# Patient Record
Sex: Female | Born: 1955 | Race: White | Hispanic: No | Marital: Married | Smoking: Former smoker
Health system: Southern US, Community
[De-identification: ages and names within clinical notes are randomized; demographics above are authoritative.]

## PROBLEM LIST (undated history)

## (undated) DIAGNOSIS — J45909 Unspecified asthma, uncomplicated: Secondary | ICD-10-CM

## (undated) DIAGNOSIS — R002 Palpitations: Secondary | ICD-10-CM

## (undated) DIAGNOSIS — C4491 Basal cell carcinoma of skin, unspecified: Secondary | ICD-10-CM

## (undated) DIAGNOSIS — E8801 Alpha-1-antitrypsin deficiency: Secondary | ICD-10-CM

## (undated) HISTORY — DX: Basal cell carcinoma of skin, unspecified: C44.91

## (undated) HISTORY — PX: TONSILLECTOMY AND ADENOIDECTOMY: SUR1326

## (undated) HISTORY — DX: Palpitations: R00.2

## (undated) HISTORY — PX: APPENDECTOMY: SHX54

## (undated) HISTORY — PX: BREAST CYST ASPIRATION: SHX578

---

## 2015-08-14 ENCOUNTER — Encounter (HOSPITAL_COMMUNITY): Payer: Self-pay | Admitting: Emergency Medicine

## 2015-08-14 ENCOUNTER — Emergency Department (HOSPITAL_COMMUNITY): Payer: BLUE CROSS/BLUE SHIELD

## 2015-08-14 ENCOUNTER — Emergency Department (HOSPITAL_COMMUNITY)
Admission: EM | Admit: 2015-08-14 | Discharge: 2015-08-14 | Disposition: A | Discharge status: Home / Self Care | Payer: BLUE CROSS/BLUE SHIELD | Attending: Emergency Medicine | Admitting: Emergency Medicine

## 2015-08-14 DIAGNOSIS — R002 Palpitations: Secondary | ICD-10-CM | POA: Diagnosis present

## 2015-08-14 DIAGNOSIS — J45909 Unspecified asthma, uncomplicated: Secondary | ICD-10-CM | POA: Insufficient documentation

## 2015-08-14 DIAGNOSIS — R110 Nausea: Secondary | ICD-10-CM | POA: Insufficient documentation

## 2015-08-14 DIAGNOSIS — R42 Dizziness and giddiness: Secondary | ICD-10-CM | POA: Insufficient documentation

## 2015-08-14 HISTORY — DX: Unspecified asthma, uncomplicated: J45.909

## 2015-08-14 HISTORY — DX: Alpha-1-antitrypsin deficiency: E88.01

## 2015-08-14 LAB — CBC
HEMATOCRIT: 41.2 % (ref 36.0–46.0)
HEMOGLOBIN: 14.1 g/dL (ref 12.0–15.0)
MCH: 31.4 pg (ref 26.0–34.0)
MCHC: 34.2 g/dL (ref 30.0–36.0)
MCV: 91.8 fL (ref 78.0–100.0)
Platelets: 283 10*3/uL (ref 150–400)
RBC: 4.49 MIL/uL (ref 3.87–5.11)
RDW: 11.9 % (ref 11.5–15.5)
WBC: 8.9 10*3/uL (ref 4.0–10.5)

## 2015-08-14 LAB — BASIC METABOLIC PANEL
ANION GAP: 7 (ref 5–15)
BUN: 16 mg/dL (ref 6–20)
CALCIUM: 9.7 mg/dL (ref 8.9–10.3)
CO2: 23 mmol/L (ref 22–32)
Chloride: 108 mmol/L (ref 101–111)
Creatinine, Ser: 0.8 mg/dL (ref 0.44–1.00)
GFR calc Af Amer: >60 mL/min (ref >60)
GFR calc non Af Amer: >60 mL/min (ref >60)
GLUCOSE: 113 mg/dL — AB (ref 65–99)
Potassium: 3.6 mmol/L (ref 3.5–5.1)
Sodium: 138 mmol/L (ref 135–145)

## 2015-08-14 LAB — I-STAT TROPONIN, ED
TROPONIN I, POC: 0.01 ng/mL (ref 0.00–0.08)
Troponin i, poc: 0 ng/mL (ref 0.00–0.08)

## 2015-08-14 MED ORDER — LORAZEPAM 1 MG PO TABS
0.5000 mg | ORAL_TABLET | Freq: Once | ORAL | Status: AC
  Administered 2015-08-14: 0.5 mg via ORAL
  Filled 2015-08-14: qty 1

## 2015-08-14 MED ORDER — LORAZEPAM 1 MG PO TABS: 1.0000 mg | ORAL_TABLET | Freq: Once | ORAL | Status: DC

## 2015-08-14 NOTE — ED Notes (Signed)
Pt. reports palpitations and upper chest discomfort " pressure" with mild nausea onset this evening , denies SOB , emesis or diaphoresis . Pt. took 1 ASA 81 mg prior to arrival .

## 2015-08-14 NOTE — ED Provider Notes (Signed)
CSN: [id number]     Arrival date & time 08/14/15  0031 History  By signing my name below, I, Emily Hays, attest that this documentation has been prepared under the direction and in the presence of Ripley Fraise, MD. Electronically Signed: Randa Hays, ED Scribe. 08/14/2015. 1:29 AM.     Chief Complaint  Patient presents with  . Palpitations    Patient is a 59 y.o. female presenting with palpitations. The history is provided by the patient. No language interpreter was used.  Palpitations Duration:  5 hours Progression:  Unchanged Chronicity:  Recurrent Relieved by:  None tried Worsened by:  Nothing Ineffective treatments:  None tried Associated symptoms: dizziness and nausea   Associated symptoms: no diaphoresis and no vomiting   HPI Comments: Emily Hays is a 59 y.o. female who presents to the Emergency Department complaining of recurrent palpitations and chest discomfort that began 5 hours PTA. Pt describes the palpitations as her "chest pounding and stopping." She does report nausea and dizziness tonight as well. Pt reports that several years prior she did have similar episodes of palpitations often but states that her last episode was several months prior. Pt states she has had cardiac work up including nuclear stress test and ECG within past 2 years. She states that she was told that she is having PVC's that are related to post menopause. She denies Hx of MI, PE or DVT's. Pt denies Hx of tobacco use or alcohol use. She denies abdominal pain, leg swelling or diaphoresis.      Past Medical History  Diagnosis Date  . Alpha-1-antitrypsin deficiency ([place])   . Asthma    Past Surgical History  Procedure Laterality Date  . Appendectomy    . Tonsillectomy     No family history on file. Social History  Substance Use Topics  . Smoking status: Never Smoker   . Smokeless tobacco: None  . Alcohol Use: Yes   OB History    No data available      Review of Systems   Constitutional: Negative for diaphoresis.  Cardiovascular: Positive for palpitations. Negative for leg swelling.  Gastrointestinal: Positive for nausea. Negative for vomiting and abdominal pain.  Neurological: Positive for dizziness. Negative for syncope.  All other systems reviewed and are negative.    Allergies  Other  Home Medications   Prior to Admission medications   Medication Sig Start Date End Date Taking? Authorizing Provider  Aspirin-Acetaminophen-Caffeine (EXCEDRIN PO) Take 2 tablets by mouth daily as needed (for headache).   Yes Historical Provider, MD  diphenhydrAMINE (BENADRYL) 25 MG tablet Take 25 mg by mouth at bedtime as needed for sleep.   Yes Historical Provider, MD  ibuprofen (ADVIL,MOTRIN) 200 MG tablet Take 400 mg by mouth every 6 (six) hours as needed for moderate pain.   Yes Historical Provider, MD   BP 139/90 mmHg  Pulse 96  Temp(Src) 97.6 F (36.4 C) (Oral)  Resp 20  Ht 5' 4.5" (1.638 m)  Wt 184 lb (83.462 kg)  BMI 31.11 kg/m2  SpO2 94%   Physical Exam   CONSTITUTIONAL: Well developed/well nourished HEAD: Normocephalic/atraumatic EYES: EOMI/PERRL ENMT: Mucous membranes moist NECK: supple no meningeal signs SPINE/BACK:entire spine nontender CV: S1/S2 noted, no murmurs/rubs/gallops noted LUNGS: Lungs are clear to auscultation bilaterally, no apparent distress ABDOMEN: soft, nontender, no rebound or guarding, bowel sounds noted throughout abdomen GU:no cva tenderness NEURO: Pt is awake/alert/appropriate, moves all extremitiesx4.  No facial droop.   EXTREMITIES: pulses normal/equal, full ROM SKIN: warm, color normal  PSYCH: no abnormalities of mood noted, alert and oriented to situation  ED Course  Procedures  DIAGNOSTIC STUDIES: Oxygen Saturation is 94% on RA, adequate by my interpretation.    COORDINATION OF CARE: 1:07 AM-Discussed treatment plan with pt at bedside and pt agreed to plan.   HEART score 3 Pt mostly complaining of  palpitations and reports long h/o similar episodes I doubt PE Will advise to monitor and check repeat ekg/troponin  4:29 AM Repeat troponin negative Pt appears improved I doubt ACS/PE or significant arrhythmia at this time Stable for d/c home Referred to local cardiology BP 112/64 mmHg  Pulse 78  Temp(Src) 97.6 F (36.4 C) (Oral)  Resp 20  Ht 5' 4.5" (1.638 m)  Wt 184 lb (83.462 kg)  BMI 31.11 kg/m2  SpO2 95%   Labs Review Labs Reviewed  BASIC METABOLIC PANEL - Abnormal; Notable for the following:    Glucose, Bld 113 (*)    All other components within normal limits  CBC  I-STAT TROPOININ, ED  Randolm Idol, ED    Imaging Review Dg Chest 2 View  08/14/2015  CLINICAL DATA:  Chest pressure.  Palpitations. EXAM: CHEST  2 VIEW COMPARISON:  None. FINDINGS: Borderline hyperinflation and bronchitic markings. Cardiomediastinal contours are normal. No pulmonary edema, focal consolidation, pleural effusion or pneumothorax. Probable calcified granuloma in the left upper lung. No acute osseous abnormalities are seen. IMPRESSION: Borderline hyperinflation and bronchitic change. Electronically Signed   By: Jeb Levering M.D.   On: 08/14/2015 01:17   I have personally reviewed and evaluated these images and lab results as part of my medical decision-making.   EKG Interpretation   Date/Time:  Sunday August 14 2015 00:39:59 EST Ventricular Rate:  99 PR Interval:  136 QRS Duration: 82 QT Interval:  358 QTC Calculation: 459 R Axis:   -65 Text Interpretation:  Sinus rhythm with frequent Premature ventricular  complexes Left anterior fascicular block Possible Anterior infarct , age  undetermined Abnormal ECG No previous ECGs available Confirmed by Christy Gentles   MD, [place] ([id number]) on 08/14/2015 12:44:57 AM      MDM   Final diagnoses:  Palpitations      Nursing notes including past medical history and social history reviewed and considered in documentation xrays/imaging  reviewed by myself and considered during evaluation Labs/vital reviewed myself and considered during evaluation   I personally performed the services described in this documentation, which was scribed in my presence. The recorded information has been reviewed and is accurate.       Ripley Fraise, MD 08/14/15 0430

## 2015-08-14 NOTE — Discharge Instructions (Signed)

## 2015-08-16 ENCOUNTER — Encounter: Payer: Self-pay | Admitting: Cardiovascular Disease

## 2015-08-16 ENCOUNTER — Ambulatory Visit (INDEPENDENT_AMBULATORY_CARE_PROVIDER_SITE_OTHER): Payer: BLUE CROSS/BLUE SHIELD

## 2015-08-16 ENCOUNTER — Ambulatory Visit (INDEPENDENT_AMBULATORY_CARE_PROVIDER_SITE_OTHER): Payer: BLUE CROSS/BLUE SHIELD | Admitting: Cardiovascular Disease

## 2015-08-16 VITALS — BP 124/90 | HR 98 | Ht 64.0 in | Wt 180.0 lb

## 2015-08-16 DIAGNOSIS — R002 Palpitations: Secondary | ICD-10-CM | POA: Diagnosis not present

## 2015-08-16 NOTE — Patient Instructions (Signed)
Medication Instructions:  Your physician recommends that you continue on your current medications as directed. Please refer to the Current Medication list given to you today.   Labwork: none  Testing/Procedures: Your physician has recommended that you wear an event monitor. Event monitors are medical devices that record the heart's electrical activity. Doctors most often [place] these monitors to diagnose arrhythmias. Arrhythmias are problems with the speed or rhythm of the heartbeat. The monitor is a small, portable device. You can wear one while you do your normal daily activities. This is usually used to diagnose what is causing palpitations/syncope (passing out). FOR 2 WEEKS.     Follow-Up: Your physician recommends that you schedule a follow-up appointment in: 1 MONTH with [facility]  Your physician recommends that you schedule a follow-up appointment in: 2 MONTHS WITH Dr. Gwenlyn Found   Any Other Special Instructions Will Be Listed Below (If Applicable). Dr. Gwenlyn Found would like you to check your blood pressure DAILY for the next 4 weeks.  Keep a journal of these daily BP and heart rate reading and call our office with the results. Bring with you to appointment with Erasmo Downer.     If you need a refill on your cardiac medications before your next appointment, please call your pharmacy.

## 2015-08-16 NOTE — Assessment & Plan Note (Signed)
Emily Hays was recently seen at [facility] emergency room because of symptomatic palpitations. She's had these for 10 years and she is 59 years old 3 menopause. They wax and wane. She is under a lot of stress at work and has gained a significant amount of weight due to lack of exercise and poor diet by her own account. She has seen cardiologists in the [place] where she lived previously prior to moving down to [place] one year ago. She's had multiple 2-D echocardiograms which been normal for mild MR, negative stress test and event monitors which have shown PVCs. She has not been on pharmacologic therapy for these. She was seen in the ER by Dr. Christy Gentles for palpitations. Workup essentially benign. She did have PVCs on monitor. She was referred here for further evaluation. She does admit to snoring at night and may have a component of sleep apnea. She drinks 3-4 cups of caffeine a day and is under a lot of stress at work. Talked about diet and lifestyle modification. I'm going to get a week event monitor. She was somewhat hypertensive today and I'm going to have her take her blood pressure daily and will see Emily Hays back in the office for follow-up. I'll see her back in 2 months. She continues to have some dramatic PVCs empirically begin low-dose beta-blockade.

## 2015-08-16 NOTE — Progress Notes (Signed)
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 She was referred by [facility] emergency room for evaluation of PVCs. She recently relocated from [place] to [place] to be closer to her family in [place]. She works as a Oncologist at [employer]. She has no cardiac risk factors although her mother and aunts have had A. Fib. She is also heterozygous alpha-1 antitrypsin deficiency has question of reactive airways disease. She was seen in the emergency room for palpitations. She's had palpitations for 10 years and she was 59 years old during menopause. These have waxed and waned. She does drink 2-4 cups of caffeine a day and is under a lot of stress at work. She denies chest pain or shortness of breath.   Current Outpatient Prescriptions  Medication Sig Dispense Refill  . Aspirin-Acetaminophen-Caffeine (EXCEDRIN PO) Take 2 tablets by mouth daily as needed (for headache).    . diphenhydrAMINE (BENADRYL) 25 MG tablet Take 25 mg by mouth at bedtime as needed for sleep.    Marland Kitchen ibuprofen (ADVIL,MOTRIN) 200 MG tablet Take 400 mg by mouth every 6 (six) hours as needed for moderate pain.     No current facility-administered medications for this visit.    Allergies  Allergen Reactions  . Other     MSG causes severe headaches    Social History   Social History  . Marital Status: Married    Spouse Name: N/A  . Number of Children: N/A  . Years of Education: N/A   Occupational History  . Not on file.   Social History Main Topics  . Smoking status: Never Smoker   . Smokeless tobacco: Not on file  . Alcohol Use: Yes  . Drug Use: No  .  Sexual Activity: Not on file   Other Topics Concern  . Not on file   Social History Narrative     Review of Systems: General: negative for chills, fever, night sweats or weight changes.  Cardiovascular: negative for chest pain, dyspnea on exertion, edema, orthopnea, palpitations, paroxysmal nocturnal dyspnea or shortness of breath Dermatological: negative for rash Respiratory: negative for cough or wheezing Urologic: negative for hematuria Abdominal: negative for nausea, vomiting, diarrhea, bright red blood per rectum, melena, or hematemesis Neurologic: negative for visual changes, syncope, or dizziness All other systems reviewed and are otherwise negative except as noted above.    Blood pressure 124/90, pulse 98, height 5\' 4"  (1.626 m), weight 180 lb (81.647 kg).  General appearance: alert and no distress Neck: no adenopathy, no carotid bruit, no JVD, supple, symmetrical, trachea midline and thyroid not enlarged, symmetric, no tenderness/mass/nodules Lungs: clear to auscultation bilaterally Heart: regular rate and rhythm, S1, S2 normal, no murmur, click, rub or gallop Extremities: extremities normal, atraumatic, no cyanosis or edema  EKG not performed today  ASSESSMENT AND PLAN:   Palpitations Miss Georgina Peer was recently seen at [facility] emergency room because of symptomatic palpitations. She's had these for 10 years and she is 59 years old 3 menopause. They wax and wane. She is under a lot of stress at work and has gained a significant amount of weight due to lack of exercise and poor  diet by her own account. She has seen cardiologists in the [place] where she lived previously prior to moving down to [place] one year ago. She's had multiple 2-D echocardiograms which been normal for mild MR, negative stress test and event monitors which have shown PVCs. She has not been on pharmacologic therapy for these. She was seen in the ER by Dr. Christy Gentles for palpitations. Workup essentially  benign. She did have PVCs on monitor. She was referred here for further evaluation. She does admit to snoring at night and may have a component of sleep apnea. She drinks 3-4 cups of caffeine a day and is under a lot of stress at work. Talked about diet and lifestyle modification. I'm going to get a week event monitor. She was somewhat hypertensive today and I'm going to have her take her blood pressure daily and will see Erasmo Downer back in the office for follow-up. I'll see her back in 2 months. She continues to have some dramatic PVCs empirically begin low-dose beta-blockade.      Lorretta Harp MD FACP,FACC,FAHA, [facility] 08/16/2015 2:12 PM

## 2015-08-17 ENCOUNTER — Telehealth: Payer: Self-pay | Admitting: Cardiovascular Disease

## 2015-08-17 NOTE — Telephone Encounter (Signed)
Patient states she has changed the electrode but about every 10 minutes an alarm goes off saying the red is not connected.  Instructed to call [facility] and they can work her through the problem.  They may need to send out a new monitor with lead wires.  (the wire may be fractured).  Patient voiced understanding and will call them today.

## 2015-08-17 NOTE — Telephone Encounter (Signed)
Emily Hays is calling because she has a monitor on and it keeps telling her that the red is not connected . States that it is in the right place , but it keeps going off. Please call   Thanks

## 2015-09-15 ENCOUNTER — Ambulatory Visit: Payer: BLUE CROSS/BLUE SHIELD | Admitting: Pharmacist Clinician (PhC)/ Clinical Pharmacy Specialist

## 2015-11-09 ENCOUNTER — Ambulatory Visit: Payer: BLUE CROSS/BLUE SHIELD | Admitting: Cardiovascular Disease

## 2015-11-30 ENCOUNTER — Ambulatory Visit: Payer: BLUE CROSS/BLUE SHIELD | Admitting: Cardiovascular Disease

## 2016-02-16 ENCOUNTER — Other Ambulatory Visit: Payer: Self-pay | Admitting: Physician Assistant

## 2016-02-16 DIAGNOSIS — Z1231 Encounter for screening mammogram for malignant neoplasm of breast: Secondary | ICD-10-CM

## 2016-03-08 ENCOUNTER — Ambulatory Visit: Payer: BLUE CROSS/BLUE SHIELD | Admitting: Obstetrics

## 2016-03-19 ENCOUNTER — Ambulatory Visit
Admission: RE | Admit: 2016-03-19 | Discharge: 2016-03-19 | Disposition: A | Discharge status: Home / Self Care | Payer: BLUE CROSS/BLUE SHIELD | Source: Ambulatory Visit | Attending: Physician Assistant | Admitting: Physician Assistant

## 2016-03-19 DIAGNOSIS — Z1231 Encounter for screening mammogram for malignant neoplasm of breast: Secondary | ICD-10-CM

## 2016-03-22 ENCOUNTER — Encounter: Payer: Self-pay | Admitting: Internal Medicine

## 2016-03-22 ENCOUNTER — Ambulatory Visit (INDEPENDENT_AMBULATORY_CARE_PROVIDER_SITE_OTHER): Payer: BLUE CROSS/BLUE SHIELD | Admitting: Internal Medicine

## 2016-03-22 ENCOUNTER — Encounter (INDEPENDENT_AMBULATORY_CARE_PROVIDER_SITE_OTHER): Payer: Self-pay

## 2016-03-22 ENCOUNTER — Other Ambulatory Visit: Payer: BLUE CROSS/BLUE SHIELD

## 2016-03-22 VITALS — BP 116/84 | HR 90 | Ht 63.5 in | Wt 172.0 lb

## 2016-03-22 DIAGNOSIS — J449 Chronic obstructive pulmonary disease, unspecified: Secondary | ICD-10-CM

## 2016-03-22 DIAGNOSIS — Z23 Encounter for immunization: Secondary | ICD-10-CM | POA: Diagnosis not present

## 2016-03-22 DIAGNOSIS — J45909 Unspecified asthma, uncomplicated: Secondary | ICD-10-CM | POA: Diagnosis not present

## 2016-03-22 DIAGNOSIS — J4489 Other specified chronic obstructive pulmonary disease: Secondary | ICD-10-CM | POA: Insufficient documentation

## 2016-03-22 MED ORDER — MOMETASONE FURO-FORMOTEROL FUM 200-5 MCG/ACT IN AERO
2.0000 | INHALATION_SPRAY | Freq: Two times a day (BID) | RESPIRATORY_TRACT | Status: DC | PRN
Start: 2016-03-22 — End: 2018-12-30

## 2016-03-22 NOTE — Patient Instructions (Addendum)
Work on inhaler technique:  relax and gently blow all the way out then take a nice smooth deep breath back in, triggering the inhaler at same time you start breathing in.  Hold for up to 5 seconds if you can. Blow out thru nose. Rinse and gargle with water when done  dulera 200 up to 2 pffs every 12  Hours as needed   Prevar 13 now   Please remember to go to the lab  department downstairs for your tests - we will call you with the results when they are available.  Please schedule a follow up visit in 3 months but call sooner if needed with pfts  On return

## 2016-03-22 NOTE — Progress Notes (Signed)
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 Pulmonary Consult    Referred by Nicholes Rough, NP. Pt states that she was dxed with Alpha 1 Antitrypsin def in 2013. She was dxed with asthma in 2013. She has occ cough with white sputum, wheezing and DOE.   doe = MMRC1 = can walk nl pace, flat grade, can't hurry or go uphills or steps s sob   Only uses dulera during flares, not clear whether using it more regularly corrects her doe   No obvious   patterns in day to day or daytime variabilty or assoc   cp or chest tightness,  overt sinus or hb symptoms. No unusual exp hx or h/o childhood pna/ asthma or knowledge of premature birth.  Sleeping ok without nocturnal  or early am exacerbation  of respiratory  c/o's or need for noct saba. Also denies any obvious fluctuation of symptoms with weather or environmental changes or other aggravating or alleviating factors except as outlined above   Current Medications, Allergies, Complete Past Medical History, Past Surgical History, Family History, and Social History were reviewed in [employer] record.                Review of Systems  Constitutional: Negative for fever, chills and unexpected weight change.  HENT: Positive for congestion and dental problem. Negative for ear pain, nosebleeds, postnasal drip, rhinorrhea, sinus pressure, sneezing, sore throat, trouble swallowing and voice change.   Eyes: Negative for visual disturbance.  Respiratory: Positive for cough and shortness of breath.  Negative for choking.   Cardiovascular: Negative for chest pain and leg swelling.  Gastrointestinal: Negative for vomiting, abdominal pain and diarrhea.  Genitourinary: Negative for difficulty urinating.  Musculoskeletal: Positive for arthralgias.  Skin: Negative for rash.  Neurological: Negative for tremors, syncope and headaches.  Hematological: Does not bruise/bleed easily.       Objective:   Physical Exam   amb wf nad / minimal pseudowheeze with fvc only   Wt Readings from Last 3 Encounters:  03/22/16 172 lb (78.019 kg)  08/16/15 180 lb (81.647 kg)  08/14/15 184 lb (83.462 kg)    Vital signs reviewed  HEENT: nl dentition, turbinates, and oropharynx. Nl external ear canals without cough reflex   NECK :  without JVD/Nodes/TM/ nl carotid upstrokes bilaterally   LUNGS: no acc muscle use,  Nl contour chest which is clear to A and P bilaterally without cough on insp or exp maneuvers   CV:  RRR  no s3 or murmur or increase in P2, no edema   ABD:  soft and nontender with nl inspiratory excursion in the supine position. No bruits or organomegaly, bowel sounds nl  MS:  Nl gait/ ext warm without deformities, calf tenderness, cyanosis or clubbing No obvious joint restrictions   SKIN: warm and dry without lesions    NEURO:  alert, approp, nl sensorium with  no motor deficits      I personally  reviewed images and agree with radiology impression as follows:  CXR:  08/14/15 Borderline hyperinflation and bronchitic change.         Assessment & Plan:

## 2016-03-22 NOTE — Assessment & Plan Note (Addendum)
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  If we find over time that she has an accelerated post saba FEV1 decline then can address potential for replacement therapy, but I doubt strongly this will be the case  Total time devoted to counseling  = 35/67m review case with pt/ discussion of options/alternatives/ personally creating written instructions  in presence of pt  then going over those specific  Instructions directly with the pt including how to use all of the meds but in particular covering each new medication in detail and the difference between the maintenance/automatic meds and the prns using an action plan format for the latter.

## 2016-03-26 ENCOUNTER — Ambulatory Visit: Payer: BLUE CROSS/BLUE SHIELD | Admitting: Obstetrics

## 2016-03-26 LAB — ALPHA-1-ANTITRYPSIN: A-1 Antitrypsin, Ser: 89 mg/dL (ref 83–199)

## 2016-03-28 LAB — ALPHA-1 ANTITRYPSIN PHENOTYPE: A-1 Antitrypsin: 83 mg/dL (ref 83–199)

## 2016-03-29 ENCOUNTER — Telehealth: Payer: Self-pay | Admitting: Internal Medicine

## 2016-03-29 NOTE — Telephone Encounter (Signed)
Results have been explained to patient, pt expressed understanding. Nothing further needed.  Notes Recorded by Rosana Berger, CMA on 03/29/2016 at 11:43 AM lmtcb Notes Recorded by Tanda Rockers, MD on 03/29/2016 at 5:58 AM Call patient : Study is c/w carrier state, Only of concern at this point for her siblings and offspring who should all be checked to be complete - if they marry another carrier, the child would have a 25% of being ZZ with serious health concerns.

## 2016-03-29 NOTE — Progress Notes (Signed)
Quick Note:  lmtcb ______ 

## 2016-03-30 NOTE — Progress Notes (Signed)
Quick Note:  Pt was notified of results ______

## 2016-04-02 ENCOUNTER — Ambulatory Visit: Payer: BLUE CROSS/BLUE SHIELD | Admitting: Obstetrics

## 2016-06-22 ENCOUNTER — Ambulatory Visit (INDEPENDENT_AMBULATORY_CARE_PROVIDER_SITE_OTHER): Payer: BLUE CROSS/BLUE SHIELD | Admitting: Internal Medicine

## 2016-06-22 ENCOUNTER — Encounter: Payer: Self-pay | Admitting: Internal Medicine

## 2016-06-22 VITALS — BP 114/68 | HR 88 | Ht 64.5 in | Wt 171.0 lb

## 2016-06-22 DIAGNOSIS — J45909 Unspecified asthma, uncomplicated: Secondary | ICD-10-CM

## 2016-06-22 DIAGNOSIS — J449 Chronic obstructive pulmonary disease, unspecified: Secondary | ICD-10-CM

## 2016-06-22 LAB — PULMONARY FUNCTION TEST
DL/VA % pred: 105 %
DL/VA: 5.12 ml/min/mmHg/L
DLCO UNC: 22.25 ml/min/mmHg
DLCO cor % pred: 86 %
DLCO cor: 21.5 ml/min/mmHg
DLCO unc % pred: 89 %
FEF 25-75 Post: 2.49 L/sec
FEF 25-75 Pre: 2.2 L/sec
FEF2575-%Change-Post: 12 %
FEF2575-%PRED-POST: 107 %
FEF2575-%Pred-Pre: 95 %
FEV1-%CHANGE-POST: 1 %
FEV1-%PRED-POST: 79 %
FEV1-%Pred-Pre: 78 %
FEV1-PRE: 2 L
FEV1-Post: 2.04 L
FEV1FVC-%Change-Post: 2 %
FEV1FVC-%Pred-Pre: 104 %
FEV6-%Change-Post: 0 %
FEV6-%PRED-PRE: 76 %
FEV6-%Pred-Post: 76 %
FEV6-POST: 2.44 L
FEV6-Pre: 2.46 L
FEV6FVC-%PRED-POST: 104 %
FEV6FVC-%Pred-Pre: 104 %
FVC-%CHANGE-POST: 0 %
FVC-%PRED-POST: 73 %
FVC-%PRED-PRE: 73 %
FVC-POST: 2.44 L
FVC-PRE: 2.46 L
POST FEV6/FVC RATIO: 100 %
PRE FEV6/FVC RATIO: 100 %
Post FEV1/FVC ratio: 83 %
Pre FEV1/FVC ratio: 81 %
RV % PRED: 96 %
RV: 1.98 L
TLC % PRED: 88 %
TLC: 4.54 L

## 2016-06-22 NOTE — Patient Instructions (Signed)
For now ok to just dulera 200 up 2 puffs every 12 hours if needed   Pulmonary follow up is as needed

## 2016-06-22 NOTE — Assessment & Plan Note (Signed)
Alpha one levels 03/22/2016 >>>  MZ  Level 89  03/22/2016  After extensive coaching HFA effectiveness =    75% > continue dulera 200 2bid prn - Prevnar 13 03/22/2016  - PFT's  06/22/2016  wnl except for very minimal curvature/ ERV 19% off all rx    She very minimal cough and her sob as reported is MMRC1 and c/w wt gain with low erv, not limed by airflow obst  She is prone to flares and wants to use prns so best choice is dulera 200 2bid prn but if wants to do maint rx instead I would favor the 100 2bid dose longterm to minimize side effects  I had an extended discussion with the patient reviewing all relevant studies completed to date and  lasting 15 to 20 minutes of a 25 minute visit    Each maintenance medication was reviewed in detail including most importantly the difference between maintenance and prns and under what circumstances the prns are to be triggered using an action plan format that is not reflected in the computer generated alphabetically organized AVS.    Please see instructions for details which were reviewed in writing and the patient given a copy highlighting the part that I personally wrote and discussed at today's ov.   Pulmonary f/u is prn

## 2016-06-22 NOTE — Progress Notes (Signed)
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 Pulmonary Consult    Referred by Nicholes Rough, NP. Pt states that she was dxed with Alpha 1 Antitrypsin def in 2013. She was dxed with asthma in 2013. She has occ cough with white sputum, wheezing and DOE.   doe = MMRC1 = can walk nl pace, flat grade, can't hurry or go uphills or steps s sob   Only uses dulera during flares, not clear whether using it more regularly corrects her doe  rec Work on inhaler technique:  relax and gently blow all the way out then take a nice smooth deep breath back in, triggering the inhaler at same time you start breathing in.  Hold for up to 5 seconds if you can. Blow out thru nose. Rinse and gargle with water when done Dulera 200 up to 2 pffs every 12  Hours as needed  Prevar 13 now     06/22/2016  f/u ov/Adrion Menz re: asthmatic bronchitis/ not using dulera 200 at this point  Chief Complaint  Patient presents with  . Follow-up    PFT's today. Breathing is overall doing well. She has occ cough with "little pearls"- clear sputum.   cough does not wake her up in am disturb her sleep but cough happens daily randomly x years but not decades   No obvious day to day or daytime variability or limiting sob or cp or chest tightness, subjective wheeze or overt sinus or hb symptoms. No unusual exp hx or h/o childhood pna/ asthma or knowledge of premature birth.  Sleeping ok  without nocturnal  or early am exacerbation  of respiratory  c/o's or need for noct saba. Also denies any obvious fluctuation of symptoms with weather or environmental changes or other aggravating or alleviating factors except as outlined above   Current Medications, Allergies, Complete Past Medical History, Past Surgical History, Family History, and Social History were reviewed in [employer] record.  ROS  The following are not active complaints unless bolded sore throat, dysphagia, dental problems, itching, sneezing,  nasal congestion or excess/ purulent secretions, ear ache,   fever, chills, sweats, unintended wt loss, classically pleuritic or exertional cp, hemoptysis,  orthopnea pnd or leg swelling, presyncope, palpitations, abdominal pain, anorexia, nausea, vomiting, diarrhea  or change in bowel or bladder habits, change in stools or urine, dysuria,hematuria,  rash, arthralgias, visual complaints, headache, numbness, weakness or ataxia or problems with walking or coordination,  change in mood/affect or memory.           Objective:   Physical Exam   amb wf nad    06/22/2016        171  03/22/16 172 lb (78.019 kg)  08/16/15 180 lb (81.647 kg)  08/14/15 184 lb (83.462 kg)  Vital signs reviewed  HEENT: nl dentition, turbinates, and oropharynx. Nl external ear canals without cough reflex   NECK :  without JVD/Nodes/TM/ nl carotid upstrokes bilaterally   LUNGS: no acc muscle use,  Nl contour chest which is clear to A and P bilaterally without cough on insp or exp maneuvers   CV:  RRR  no s3 or murmur or increase in P2, no edema   ABD:  soft and nontender with nl inspiratory excursion in the supine position. No bruits or organomegaly, bowel sounds nl  MS:  Nl gait/ ext warm without deformities, calf tenderness, cyanosis or clubbing No obvious joint restrictions   SKIN: warm and dry without lesions    NEURO:  alert, approp, nl sensorium with  no motor  deficits      I personally reviewed images and agree with radiology impression as follows:  CXR:  08/14/15  Borderline hyperinflation and bronchitic change.         Assessment & Plan:

## 2016-06-22 NOTE — Progress Notes (Signed)
PFT performed today. 

## 2017-03-19 ENCOUNTER — Other Ambulatory Visit: Payer: Self-pay | Admitting: Physician Assistant

## 2017-03-19 DIAGNOSIS — Z1231 Encounter for screening mammogram for malignant neoplasm of breast: Secondary | ICD-10-CM

## 2017-03-22 ENCOUNTER — Other Ambulatory Visit: Payer: Self-pay | Admitting: Physician Assistant

## 2017-03-22 DIAGNOSIS — R5381 Other malaise: Secondary | ICD-10-CM

## 2017-03-29 ENCOUNTER — Other Ambulatory Visit: Payer: Self-pay | Admitting: Physician Assistant

## 2017-03-29 DIAGNOSIS — R52 Pain, unspecified: Secondary | ICD-10-CM

## 2017-04-09 ENCOUNTER — Other Ambulatory Visit: Payer: Self-pay | Admitting: Physician Assistant

## 2017-04-09 ENCOUNTER — Ambulatory Visit
Admission: RE | Admit: 2017-04-09 | Discharge: 2017-04-09 | Disposition: A | Discharge status: Home / Self Care | Payer: BLUE CROSS/BLUE SHIELD | Source: Ambulatory Visit | Attending: Physician Assistant | Admitting: Physician Assistant

## 2017-04-09 DIAGNOSIS — R52 Pain, unspecified: Secondary | ICD-10-CM

## 2017-04-09 DIAGNOSIS — N6001 Solitary cyst of right breast: Secondary | ICD-10-CM

## 2017-09-26 ENCOUNTER — Other Ambulatory Visit (HOSPITAL_COMMUNITY): Payer: Self-pay | Admitting: Physician Assistant

## 2017-09-26 DIAGNOSIS — D490 Neoplasm of unspecified behavior of digestive system: Secondary | ICD-10-CM

## 2017-09-30 ENCOUNTER — Ambulatory Visit (HOSPITAL_COMMUNITY)
Admission: RE | Admit: 2017-09-30 | Discharge: 2017-09-30 | Disposition: A | Discharge status: Home / Self Care | Payer: BLUE CROSS/BLUE SHIELD | Source: Ambulatory Visit | Attending: Physician Assistant | Admitting: Physician Assistant

## 2017-09-30 ENCOUNTER — Other Ambulatory Visit (HOSPITAL_COMMUNITY): Payer: Self-pay | Admitting: Physician Assistant

## 2017-09-30 DIAGNOSIS — D490 Neoplasm of unspecified behavior of digestive system: Secondary | ICD-10-CM | POA: Diagnosis present

## 2017-09-30 DIAGNOSIS — K862 Cyst of pancreas: Secondary | ICD-10-CM | POA: Diagnosis not present

## 2017-09-30 LAB — POCT I-STAT CREATININE: Creatinine, Ser: 0.7 mg/dL (ref 0.44–1.00)

## 2017-09-30 MED ORDER — GADOBENATE DIMEGLUMINE 529 MG/ML IV SOLN
17.0000 mL | Freq: Once | INTRAVENOUS | Status: AC | PRN
  Administered 2017-09-30: 17 mL via INTRAVENOUS

## 2017-10-11 ENCOUNTER — Other Ambulatory Visit: Payer: BLUE CROSS/BLUE SHIELD

## 2017-10-25 ENCOUNTER — Other Ambulatory Visit: Payer: Self-pay | Admitting: Physician Assistant

## 2017-10-25 ENCOUNTER — Ambulatory Visit
Admission: RE | Admit: 2017-10-25 | Discharge: 2017-10-25 | Disposition: A | Discharge status: Home / Self Care | Payer: BLUE CROSS/BLUE SHIELD | Source: Ambulatory Visit | Attending: Physician Assistant | Admitting: Physician Assistant

## 2017-10-25 DIAGNOSIS — N6001 Solitary cyst of right breast: Secondary | ICD-10-CM

## 2017-10-25 DIAGNOSIS — N63 Unspecified lump in unspecified breast: Secondary | ICD-10-CM

## 2017-10-25 DIAGNOSIS — N6310 Unspecified lump in the right breast, unspecified quadrant: Secondary | ICD-10-CM

## 2018-01-14 ENCOUNTER — Telehealth: Payer: Self-pay | Admitting: Cardiovascular Disease

## 2018-01-14 NOTE — Telephone Encounter (Signed)
Patient c/o Palpitations:  High priority if patient c/o lightheadedness, shortness of breath, or chest pain  How long have you had palpitations/irregular HR/ Afib? Are you having the symptoms now?  PVCs Are you currently experiencing lightheadedness, SOB or CP? no 1) Do you have a history of afib (atrial fibrillation) or irregular heart rhythm? no  2) Have you checked your BP or HR? (document readings if available): no  3) Are you experiencing any other symptoms?  dul thobbing

## 2018-01-14 NOTE — Telephone Encounter (Signed)
Returned the call to the patient. She stated that for the last several days she has been having what feels like PVC's and a dull ache. She stated that she is under a lot of stress at work right now and stated that in the past this has started the PVC's. The patient declined an appointment for today but has been put on the schedule for tomorrow with an APP. She will go to the hospital if her symptoms progress or she develops chest pain.

## 2018-01-15 ENCOUNTER — Encounter: Payer: Self-pay | Admitting: Physician Assistant

## 2018-01-15 ENCOUNTER — Ambulatory Visit (INDEPENDENT_AMBULATORY_CARE_PROVIDER_SITE_OTHER): Payer: BLUE CROSS/BLUE SHIELD | Admitting: Physician Assistant

## 2018-01-15 VITALS — BP 124/78 | HR 73 | Ht 64.0 in | Wt 173.0 lb

## 2018-01-15 DIAGNOSIS — I493 Ventricular premature depolarization: Secondary | ICD-10-CM

## 2018-01-15 DIAGNOSIS — R072 Precordial pain: Secondary | ICD-10-CM

## 2018-01-15 MED ORDER — METOPROLOL TARTRATE 25 MG PO TABS: ORAL_TABLET | ORAL | 0 refills | Status: DC

## 2018-01-15 MED ORDER — METOPROLOL TARTRATE 25 MG PO TABS
ORAL_TABLET | ORAL | 0 refills | Status: DC
Start: 2018-01-15 — End: 2018-01-28

## 2018-01-15 NOTE — Progress Notes (Signed)
Cardiology Office Note    Date:  01/15/2018   ID:  Emily Hays, DOB 1956-04-03, MRN [id number]  PCP:  Nicholes Rough, PA-C  Cardiologist:  Dr. Gwenlyn Found   Chief Complaint  Patient presents with  . Follow-up    seen for Dr. Gwenlyn Found. pt complains of dull chest pain in the middle, feet cramps, pt denies swelling in hands/feet, SOB,     History of Present Illness:  Emily Hays is a 62 y.o. female with PMH of heterozygous alpha-1 antitrypsin deficiency, history of skin basal cell carcinoma, asthma and history of PVCs.  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.  She started having palpitation since that she was 62 years old.  She was seen by Dr. Gwenlyn Found in November 2016, at that time she was under a lot of stress at work and a drink 2-4 cups of coffee per day.  Based on previous office note, it appears patient had echocardiogram and stress test before. 2-week event monitor in November 2016 shows normal sinus rhythm with rare PACs and PVCs.  Patient presents to cardiology office today for evaluation of substernal chest pressure that has been intermittent for the past 3 days.  She first noticed it on Sunday while sitting down.  She described it as a dull ache that lasted several hours before going away.  They recurred both yesterday at this morning as well.  She denies any obvious shortness of breath or worsening degree of chest discomfort associated with physical exertion.  She denies any recent increase of palpitation.  Multiple members of her family has pacemaker and one of her aunt had history of MI in her early 60s.  We discussed various workup options including stress testing versus coronary CT, her cardiac risk factors would include age and a family history.  I recommend a coronary CT with FFR at this time.  She will need a dose of 50 mg metoprolol in the morning of the coronary CT.  She will also need basic metabolic panel as well.  If the coronary CT is negative, she can follow-up with cardiology only on  a as needed basis.  Furthermore given the prolonged duration of the chest discomfort, I will obtain a stat troponin I today to make sure there is no evidence of ACS.   Past Medical History:  Diagnosis Date  . Alpha-1-antitrypsin deficiency ([place])   . Asthma   . Basal cell carcinoma of skin   . Palpitations    PVCs    Past Surgical History:  Procedure Laterality Date  . APPENDECTOMY    . BREAST CYST ASPIRATION    . TONSILLECTOMY AND ADENOIDECTOMY      Current Medications: Outpatient Medications Prior to Visit  Medication Sig Dispense Refill  . diphenhydrAMINE (BENADRYL) 50 MG tablet Take 50 mg by mouth at bedtime as needed for allergies or sleep.    Marland Kitchen ibuprofen (ADVIL,MOTRIN) 200 MG tablet Take 200 mg by mouth every 6 (six) hours as needed.    . mometasone-formoterol (DULERA) 200-5 MCG/ACT AERO Inhale 2 puffs into the lungs 2 (two) times daily as needed for wheezing. 1 Inhaler 11   No facility-administered medications prior to visit.      Allergies:   Other   Social History   Socioeconomic History  . Marital status: Married    Spouse name: Not on file  . Number of children: Not on file  . Years of education: Not on file  . Highest education level: Not on file  Occupational  History  . Not on file  Social Needs  . Financial resource strain: Not on file  . Food insecurity:    Worry: Not on file    Inability: Not on file  . Transportation needs:    Medical: Not on file    Non-medical: Not on file  Tobacco Use  . Smoking status: Former Smoker    Packs/day: 0.25    Years: 8.00    Pack years: 2.00    Types: Cigarettes    Last attempt to quit: 09/24/1988    Years since quitting: 29.3  . Smokeless tobacco: Never Used  Substance and Sexual Activity  . Alcohol use: Yes    Alcohol/week: 0.0 oz    Comment: occ wine  . Drug use: No  . Sexual activity: Not on file  Lifestyle  . Physical activity:    Days per week: Not on file    Minutes per session: Not on file  .  Stress: Not on file  Relationships  . Social connections:    Talks on phone: Not on file    Gets together: Not on file    Attends religious service: Not on file    Active member of club or organization: Not on file    Attends meetings of clubs or organizations: Not on file    Relationship status: Not on file  Other Topics Concern  . Not on file  Social History Narrative  . Not on file     Family History:  The patient's family history includes Alpha-1 antitrypsin deficiency in her brother; Atrial fibrillation in her mother; Breast cancer in her cousin; COPD in her mother; Cancer in her father; Head & neck cancer in her father; Heart failure in her mother; Hypertension in her mother.   ROS:   Please see the history of present illness.    ROS All other systems reviewed and are negative.   PHYSICAL EXAM:   VS:  BP 124/78   Pulse 73   Ht 5\' 4"  (1.626 m)   Wt 173 lb (78.5 kg)   BMI 29.70 kg/m    GEN: Well nourished, well developed, in no acute distress  HEENT: normal  Neck: no JVD, carotid bruits, or masses Cardiac: RRR; no murmurs, rubs, or gallops,no edema  Respiratory:  clear to auscultation bilaterally, normal work of breathing GI: soft, nontender, nondistended, + BS MS: no deformity or atrophy  Skin: warm and dry, no rash Neuro:  Alert and Oriented x 3, Strength and sensation are intact Psych: euthymic mood, full affect  Wt Readings from Last 3 Encounters:  01/15/18 173 lb (78.5 kg)  06/22/16 171 lb (77.6 kg)  03/22/16 172 lb (78 kg)      Studies/Labs Reviewed:   EKG:  EKG is ordered today.  The ekg ordered today demonstrates NSR with LAFB  Recent Labs: 09/30/2017: Creatinine, Ser 0.70   Lipid Panel No results found for: CHOL, TRIG, HDL, CHOLHDL, VLDL, LDLCALC, LDLDIRECT  Additional studies/ records that were reviewed today include:   Event monitor 08/16/2015 Study Highlights   1. NSR 2. Rare PACs and PVCs     ASSESSMENT:    1. Precordial chest pain     2. PVC's (premature ventricular contractions)      PLAN:  In order of problems listed above:  1. Precordial chest pain: She had a prolonged episode of chest pain on Sunday, will obtain troponin.  Cardiac risk factors include age and family history, given recurrence of the chest discomfort, I  will obtain coronary CT with FFR  2. History of PVCs: Well-controlled on current medication    Medication Adjustments/Labs and Tests Ordered: Current medicines are reviewed at length with the patient today.  Concerns regarding medicines are outlined above.  Medication changes, Labs and Tests ordered today are listed in the Patient Instructions below. Patient Instructions  Medication Instructions:  Your physician recommends that you continue on your current medications as directed. Please refer to the Current Medication list given to you today.  Labwork: Your physician recommends that you return for lab work in: [facility]   Your physician recommends that you return for lab work in: [facility] [place] TEST   Testing/Procedures: SEE BELOW  Follow-Up: FOLLOW UP ON A AS NEEDED BASIS PENDING TEST RESULTS  Any Other Special Instructions Will Be Listed Below (If Applicable).  If you need a refill on your cardiac medications before your next appointment, please call your pharmacy.   INSTRUCTIONS FOR CORONARY CTA Please arrive at the [facility] main entrance of [facility] at (30-45 minutes prior to test start time)  [facility] [place], [place] [address] [phone]  Proceed to the [facility] Radiology Department (First Floor).  Please follow these instructions carefully (unless otherwise directed): PLEASE HAVE LABS - BMP  AT LEAST ONE WEEK PRIOR TO TEST  On the Night Before the Test: Drink plenty of water. Do not consume any caffeinated/decaffeinated beverages or chocolate 12 hours prior to your test. Do not take any antihistamines 12 hours  prior to your test.  On the Day of the Test: Drink plenty of water. Do not drink any water within one hour of the test. Do not eat any food 4 hours prior to the test. You may take your regular medications prior to the test. Take 50 mg of Lopressor (Metoprolol) one hour before the test.  After the Test: Drink plenty of water. After receiving IV contrast, you may experience a mild flushed feeling. This is normal. On occasion, you may experience a mild rash up to 24 hours after the test. This is not dangerous. If this occurs, you can take Benadryl 25 mg and increase your fluid intake. If you experience trouble breathing, this can be serious. If it is severe call 911 IMMEDIATELY. If it is mild, please call our office.      Hilbert Corrigan, [place]  01/15/2018 5:49 PM    [facility] [place], [place], [place]  [address] Phone: [phone]; Fax: [phone]

## 2018-01-15 NOTE — Patient Instructions (Addendum)
Medication Instructions:  Your physician recommends that you continue on your current medications as directed. Please refer to the Current Medication list given to you today.  Labwork: Your physician recommends that you return for lab work in: [facility]   Your physician recommends that you return for lab work in: [address] [place] TEST   Testing/Procedures: SEE BELOW  Follow-Up: FOLLOW UP ON A AS NEEDED BASIS PENDING TEST RESULTS  Any Other Special Instructions Will Be Listed Below (If Applicable).  If you need a refill on your cardiac medications before your next appointment, please call your pharmacy.   INSTRUCTIONS FOR CORONARY CTA Please arrive at the [facility] main entrance of [facility] at (30-45 minutes prior to test start time)  [facility] [place],  [address] [phone]  Proceed to the [facility] Radiology Department (First Floor).  Please follow these instructions carefully (unless otherwise directed): PLEASE HAVE LABS - BMP  AT LEAST ONE WEEK PRIOR TO TEST  On the Night Before the Test: Drink plenty of water. Do not consume any caffeinated/decaffeinated beverages or chocolate 12 hours prior to your test. Do not take any antihistamines 12 hours prior to your test.  On the Day of the Test: Drink plenty of water. Do not drink any water within one hour of the test. Do not eat any food 4 hours prior to the test. You may take your regular medications prior to the test. Take 50 mg of Lopressor (Metoprolol) one hour before the test.  After the Test: Drink plenty of water. After receiving IV contrast, you may experience a mild flushed feeling. This is normal. On occasion, you may experience a mild rash up to 24 hours after the test. This is not dangerous. If this occurs, you can take Benadryl 25 mg and increase your fluid intake. If you experience trouble breathing, this can be serious. If it is severe call 911  IMMEDIATELY. If it is mild, please call our office.

## 2018-01-16 LAB — TROPONIN I

## 2018-01-28 ENCOUNTER — Ambulatory Visit (INDEPENDENT_AMBULATORY_CARE_PROVIDER_SITE_OTHER): Payer: BLUE CROSS/BLUE SHIELD | Admitting: Cardiovascular Disease

## 2018-01-28 ENCOUNTER — Encounter: Payer: Self-pay | Admitting: Cardiovascular Disease

## 2018-01-28 DIAGNOSIS — R002 Palpitations: Secondary | ICD-10-CM | POA: Diagnosis not present

## 2018-01-28 DIAGNOSIS — R0789 Other chest pain: Secondary | ICD-10-CM | POA: Diagnosis not present

## 2018-01-28 DIAGNOSIS — E7800 Pure hypercholesterolemia, unspecified: Secondary | ICD-10-CM | POA: Diagnosis not present

## 2018-01-28 DIAGNOSIS — E785 Hyperlipidemia, unspecified: Secondary | ICD-10-CM | POA: Insufficient documentation

## 2018-01-28 NOTE — Assessment & Plan Note (Signed)
History of atypical chest pain 2 weeks ago when she saw Almyra Deforest  PA-C in the office.  She was under a lot of stress at that time.  The pain was substernal, sharp and occurred at rest.  It has since resolved spontaneously.  At this point, I do not see a need to pursue coronary CTA.

## 2018-01-28 NOTE — Progress Notes (Signed)
01/28/2018 Arnette Norris   [birth year]  [id number]  Primary Physician Nicholes Rough, PA-C Primary Cardiologist: Lorretta Harp MD Lupe Carney, [place]  HPI:  Emily Hays is a 62 y.o.  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 She was referred by [facility] emergency room for evaluation of PVCs.  I last saw her in the office 08/16/2015.  She relocated from [place] to [place] to be closer to her family in [place]. She works as a Oncologist at [employer]. She has no cardiac risk factors although her mother and aunts have had A. Fib. She is also heterozygous alpha-1 antitrypsin deficiency has question of reactive airways disease. She was seen in the emergency room for palpitations. She's had palpitations for 10 years and she was 62 years old during menopause. These had  waxed and waned. She did  drink 2-4 cups of caffeine a day and was  under a lot of stress at work.  An event monitor apparently showed PVCs.  She did alter her diet decrease her caffeine intake.  Her palpitations resolved.  She had been well up until 2 weeks ago when she developed atypical sharp substernal chest pain over the several days.  She saw Almyra Deforest PA-C in the office who had scheduled coronary CTA on her but her pain substernally has spontaneously resolved.  Current Meds  Medication Sig  . diphenhydrAMINE (BENADRYL) 50 MG tablet Take 50 mg by mouth at bedtime as needed for allergies or sleep.  Marland Kitchen ibuprofen (ADVIL,MOTRIN) 200 MG tablet Take 200 mg by mouth every 6 (six) hours as needed.  . mometasone-formoterol (DULERA) 200-5 MCG/ACT AERO Inhale 2 puffs into the lungs 2 (two) times daily as needed for wheezing.  . Pseudoeph-Doxylamine-DM-APAP (NYQUIL PO) Take 1 tablet by mouth as directed.     Allergies  Allergen Reactions  . Other     MSG causes severe  headaches    Social History   Socioeconomic History  . Marital status: Married    Spouse name: Not on file  . Number of children: Not on file  . Years of education: Not on file  . Highest education level: Not on file  Occupational History  . Not on file  Social Needs  . Financial resource strain: Not on file  . Food insecurity:    Worry: Not on file    Inability: Not on file  . Transportation needs:    Medical: Not on file    Non-medical: Not on file  Tobacco Use  . Smoking status: Former Smoker    Packs/day: 0.25    Years: 8.00    Pack years: 2.00    Types: Cigarettes    Last attempt to quit: 09/24/1988    Years since quitting: 29.3  . Smokeless tobacco: Never Used  Substance and Sexual Activity  . Alcohol use: Yes    Alcohol/week: 0.0 oz    Comment: occ wine  . Drug use: No  . Sexual activity: Not on file  Lifestyle  . Physical activity:    Days per week: Not on file    Minutes per session: Not on file  . Stress: Not on file  Relationships  . Social connections:    Talks on phone: Not on file    Gets together: Not on file    Attends religious service: Not on  file    Active member of club or organization: Not on file    Attends meetings of clubs or organizations: Not on file    Relationship status: Not on file  . Intimate partner violence:    Fear of current or ex partner: Not on file    Emotionally abused: Not on file    Physically abused: Not on file    Forced sexual activity: Not on file  Other Topics Concern  . Not on file  Social History Narrative  . Not on file     Review of Systems: General: negative for chills, fever, night sweats or weight changes.  Cardiovascular: negative for chest pain, dyspnea on exertion, edema, orthopnea, palpitations, paroxysmal nocturnal dyspnea or shortness of breath Dermatological: negative for rash Respiratory: negative for cough or wheezing Urologic: negative for hematuria Abdominal: negative for nausea, vomiting,  diarrhea, bright red blood per rectum, melena, or hematemesis Neurologic: negative for visual changes, syncope, or dizziness All other systems reviewed and are otherwise negative except as noted above.    Blood pressure 129/82, pulse 81, height 5\' 4"  (1.626 m), weight 174 lb 3.2 oz (79 kg), SpO2 98 %.  General appearance: alert and no distress Neck: no adenopathy, no carotid bruit, no JVD, supple, symmetrical, trachea midline and thyroid not enlarged, symmetric, no tenderness/mass/nodules Lungs: clear to auscultation bilaterally Heart: regular rate and rhythm, S1, S2 normal, no murmur, click, rub or gallop Extremities: extremities normal, atraumatic, no cyanosis or edema Pulses: 2+ and symmetric Skin: Skin color, texture, turgor normal. No rashes or lesions Neurologic: Alert and oriented X 3, normal strength and tone. Normal symmetric reflexes. Normal coordination and gait  EKG not performed today  ASSESSMENT AND PLAN:   Palpitations History of palpitations in the past believed related to stress determined to be PVCs.  She did cut out her caffeine and no longer has palpitations.  Hyperlipidemia History of hyperlipidemia not on statin therapy with lipid profile 04/17/2017 revealing total cholesterol of 214 with an LDL of 137.  She has been on Lipitor in the past which she did not tolerate.  She does admit to a not heart healthy diet.  We talked about altering her diet and rechecking lipid liver profile in 3 months.  Atypical chest pain History of atypical chest pain 2 weeks ago when she saw Almyra Deforest  PA-C in the office.  She was under a lot of stress at that time.  The pain was substernal, sharp and occurred at rest.  It has since resolved spontaneously.  At this point, I do not see a need to pursue coronary CTA.      Lorretta Harp MD FACP,FACC,FAHA, [facility] 01/28/2018 2:15 PM

## 2018-01-28 NOTE — Assessment & Plan Note (Addendum)
History of palpitations in the past believed related to stress determined to be PVCs.  She did cut out her caffeine and no longer has palpitations.

## 2018-01-28 NOTE — Patient Instructions (Signed)
Medication Instructions: Your physician recommends that you continue on your current medications as directed. Please refer to the Current Medication list given to you today.  Labwork: Your physician recommends that you return for a FASTING lipid profile and hepatic function panel in 3 months.   Follow-Up: We request that you follow-up in: 6 months with Almyra Deforest, PA and in 12 months with Dr Andria Rhein will receive a reminder letter in the mail two months in advance. If you don't receive a letter, please call our office to schedule the follow-up appointment.  If you need a refill on your cardiac medications before your next appointment, please call your pharmacy.

## 2018-01-28 NOTE — Assessment & Plan Note (Signed)
History of hyperlipidemia not on statin therapy with lipid profile 04/17/2017 revealing total cholesterol of 214 with an LDL of 137.  She has been on Lipitor in the past which she did not tolerate.  She does admit to a not heart healthy diet.  We talked about altering her diet and rechecking lipid liver profile in 3 months.

## 2018-02-13 ENCOUNTER — Ambulatory Visit (HOSPITAL_COMMUNITY): Payer: BLUE CROSS/BLUE SHIELD

## 2018-04-28 ENCOUNTER — Ambulatory Visit
Admission: RE | Admit: 2018-04-28 | Discharge: 2018-04-28 | Disposition: A | Discharge status: Home / Self Care | Payer: BLUE CROSS/BLUE SHIELD | Source: Ambulatory Visit | Attending: Physician Assistant | Admitting: Physician Assistant

## 2018-04-28 DIAGNOSIS — N63 Unspecified lump in unspecified breast: Secondary | ICD-10-CM

## 2018-04-28 DIAGNOSIS — N6310 Unspecified lump in the right breast, unspecified quadrant: Secondary | ICD-10-CM

## 2018-12-25 ENCOUNTER — Telehealth: Payer: Self-pay | Admitting: Internal Medicine

## 2018-12-25 NOTE — Telephone Encounter (Signed)
Pt is calling back [phone]

## 2018-12-25 NOTE — Telephone Encounter (Signed)
Pt works at [employer]. She wanted to know outside of handwashing, mask, gloves if there are any other precautions she should be taking. She is concerned because of GY:IRSWNIOEVO and Alpha-1 trait. Please advise if any other precautions should be taken.

## 2018-12-25 NOTE — Telephone Encounter (Signed)
The precautions are subject to change on daily basis for healthcare workers and the general public and are available updated daily at [email or web address].  The MZ status doesn't have any additional specific concerns at this point as long as all the other precautions are followed eg no cigarette exposure/   treatment of bronchitis if active but since I haven't seen in over a year I can't comment on her present rx without at least a televisit which I'm happy to do.

## 2018-12-25 NOTE — Telephone Encounter (Signed)
Called patient unable to reach LMTCB 

## 2018-12-25 NOTE — Telephone Encounter (Signed)
lmom 

## 2018-12-26 NOTE — Telephone Encounter (Signed)
Patient is now aware  Tele visit schedule nothing further needed.

## 2018-12-30 ENCOUNTER — Other Ambulatory Visit: Payer: Self-pay | Admitting: Internal Medicine

## 2018-12-30 ENCOUNTER — Ambulatory Visit (INDEPENDENT_AMBULATORY_CARE_PROVIDER_SITE_OTHER): Payer: Self-pay | Admitting: Internal Medicine

## 2018-12-30 ENCOUNTER — Encounter: Payer: Self-pay | Admitting: *Deleted

## 2018-12-30 ENCOUNTER — Other Ambulatory Visit: Payer: Self-pay

## 2018-12-30 ENCOUNTER — Encounter: Payer: Self-pay | Admitting: Internal Medicine

## 2018-12-30 DIAGNOSIS — J449 Chronic obstructive pulmonary disease, unspecified: Secondary | ICD-10-CM

## 2018-12-30 MED ORDER — ALBUTEROL SULFATE HFA 108 (90 BASE) MCG/ACT IN AERS: INHALATION_SPRAY | RESPIRATORY_TRACT | 1 refills | Status: AC

## 2018-12-30 MED ORDER — MOMETASONE FURO-FORMOTEROL FUM 200-5 MCG/ACT IN AERO
INHALATION_SPRAY | RESPIRATORY_TRACT | 11 refills | Status: AC
Start: 2018-12-30 — End: ?

## 2018-12-30 NOTE — Patient Instructions (Addendum)
Fax to [phone]  Plan A = Automatic = Dulera 200 Take 2 puffs first thing in am and then another 2 puffs about 12 hours later.      Plan B = Backup Only use your albuterol (proair) inhaler as a rescue medication to be used if you can't catch your breath by resting or doing a relaxed purse lip breathing pattern.  - The less you use it, the better it will work when you need it. - Ok to use the inhaler up to 2 puffs  every 4 hours if you must but call for appointment if use goes up over your usual need - Don't leave home without it !!  (think of it like the spare tire for your car)     If you are satisfied with your treatment plan,  let your doctor know and he/she can either refill your medications or you can return here when your prescription runs out.     If in any way you are not 100% satisfied,  please tell us.  If 100% better, tell your friends!  Pulmonary follow up is as needed       Letter re: employment:  Due to   Asthma and [place] phenotype and age, I recommend patient be allowed to work only if she can maintain the physical distancing recommended by the [facility] and if not then she would be at risk of serious complications from the coronavirus and should be allowed to work from home or be off work indefinitely until the restrictions are listed.  At this point she should avoid all contact with patients and visitors  Until  COVID - 19 restrictions have been lifted.

## 2018-12-30 NOTE — Progress Notes (Signed)
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 Pulmonary Consult    Referred by Nicholes Rough, NP. Pt states that she was dxed with Alpha 1 Antitrypsin def in 2013. She was dxed with asthma in 2013. She has occ cough with white sputum, wheezing and DOE.   doe = MMRC1 = can walk nl pace, flat grade, can't hurry or go uphills or steps s sob   Only uses dulera during flares, not clear whether using it more regularly corrects her doe  rec Work on inhaler technique:  relax and gently blow all the way out then take a nice smooth deep breath back in, triggering the inhaler at same time you start breathing in.  Hold for up to 5 seconds if you can. Blow out thru nose. Rinse and gargle with water when done Dulera 200 up to 2 pffs every 12  Hours as needed  Prevar 13 now     06/22/2016  f/u ov/Giovanne Nickolson re: asthmatic bronchitis/ not using dulera 200 at this point  Chief Complaint  Patient presents with  . Follow-up    PFT's today. Breathing is overall doing well. She has occ cough with "little pearls"- clear sputum.   cough does not wake her up in am disturb her sleep but cough happens daily randomly x years but not decades  rec For now ok to just dulera 200 up 2 puffs every 12 hours if needed  Follow up is as needed     Virtual Visit via Telephone Note 12/30/2018   I connected with [name] on 12/30/18 at 12:00 PM EDT by telephone and  verified that I am speaking with the correct person using two identifiers.   I discussed the limitations, risks, security and privacy concerns of performing an evaluation and management service by telephone and the availability of in person appointments. I also discussed with the patient that there may be a patient responsible charge related to this service. The patient expressed understanding and agreed to proceed.   History of Present Illness:  Alpha one levels 03/22/2016 >>>  MZ  Level 89  03/22/2016  After extensive coaching HFA effectiveness =    75% > continue dulera 200 2bid prn - Prevnar 13 03/22/2016  - PFT's  06/22/2016  wnl except for very minimal curvature/ ERV 19% off all rx    Only needing dulera a few times a month but uses 2 bid with flares typically related to uri's Dyspnea:  Not limited by breathing from desired activities   Cough: none Sleeping: ok flat  SABA use: none at present but doesn't have rescue 02: none  Her job has required her to continue to have contact with pt/ families at [place] and she is concerned about the risk given her AB and alpha one MZ  status    No obvious day to day or daytime variability or assoc excess/ purulent sputum or mucus plugs or hemoptysis or cp or chest tightness, subjective wheeze or overt sinus or hb symptoms.    Also denies any obvious fluctuation of symptoms with weather or environmental changes or other aggravating or alleviating factors except as outlined above.   Meds reviewed/ med reconciliation completed        Observations/Objective:   Assessment and Plan: See problem list for active a/p's   Follow Up Instructions: See avs for instructions unique to this ov which includes revised/ updated med list     I discussed the assessment and treatment plan with the patient. The patient was provided an opportunity to ask questions and all were answered. The patient agreed with the plan and demonstrated an understanding of the  instructions.   The patient was advised to call back or seek an in-person evaluation if the symptoms worsen or if the condition fails to improve as anticipated.  I provided 30 minutes of non-face-to-face time during this encounter.   Christinia Gully, MD

## 2018-12-30 NOTE — Telephone Encounter (Signed)
Would strongly prefer symbicort 160  Vs Breo  But if they don't cover symbicor then the Breo 100 is the right choice, not the 200

## 2018-12-30 NOTE — Assessment & Plan Note (Signed)
Alpha one levels 03/22/2016 >>>  MZ  Level 89  03/22/2016  After extensive coaching HFA effectiveness =    75% > continue dulera 200 2bid prn - Prevnar 13 03/22/2016  - PFT's  06/22/2016  wnl except for very minimal curvature/ ERV 19% off all rx      Despite MZ status, All goals of chronic asthma control met including optimal function and elimination of symptoms with no  need for rescue therapy.  Based on two studies from NEJM  378; 20 p 1865 (2018) and 380 : p2020-30 (2019) in pts with mild asthma it is reasonable to use low dose symbicort eg 80 (and by inference = dulera 200 should do)  2bid "prn" flare in this setting   and takes advantage of the rapid onset of action but is not the same as "rescue therapy" but can be stopped once the acute symptoms have resolved and the need for rescue has been minimized (< 2 x weekly)    Contingencies discussed in full including contacting this office immediately if not controlling the symptoms using the rule of two's.     Also discussed risk of coronavirus both community and [facility] exposures need to minimized.   If continues to have good control can f/u yearly or refills per PCP  

## 2018-12-30 NOTE — Telephone Encounter (Signed)
Pt saw MW for televisit today 12/30/2018 at 12:00 PM. According to AVS, MW recommended and prescribed Dulera as her maintenance inhaler.  Pharmacy is now requesting an alternative of Breo because Ruthe Mannan is non-formulary. Memory Dance is covered by [employer], however, it is not what was originally prescribed.   MW please advise on the refill of this medication. Thank you.

## 2018-12-31 MED ORDER — FLUTICASONE FUROATE-VILANTEROL 100-25 MCG/INH IN AEPB: 1.0000 | INHALATION_SPRAY | Freq: Every day | RESPIRATORY_TRACT | 0 refills | Status: AC

## 2018-12-31 MED ORDER — BUDESONIDE-FORMOTEROL FUMARATE 160-4.5 MCG/ACT IN AERO: 2.0000 | INHALATION_SPRAY | Freq: Two times a day (BID) | RESPIRATORY_TRACT | 0 refills | Status: DC

## 2018-12-31 NOTE — Telephone Encounter (Signed)
LMTCB in regards to inhaler change.    Dulera non formulary Pharmacy states Memory Dance is alternative Dr. Melvyn Novas prefers Symbicort. Pharmacist states there is an Network engineer for [employer]. Suggest sending both to see which is less expensive. The will fill less expensive.

## 2019-01-01 ENCOUNTER — Telehealth: Payer: Self-pay | Admitting: Internal Medicine

## 2019-01-01 NOTE — Telephone Encounter (Signed)
Emily Hays was working on this problem yesterday but should be able to get symbicort 160 2bid with coupon we sent her and should be in mail - if don't get it by April 13th let  Me know.  breo is a different med/ device whereas symbicort is the same.  If they don't / won't cover symbicort, let me know and I will contact [place] as the coupon is supposed to be guaranteed.

## 2019-01-01 NOTE — Telephone Encounter (Signed)
Called and spoke with pt stating to her that MW said Symbicort is the equivalence to [facility] which she is currently on and [place] mailed a coupon card to her address yesterday, 4/8. Stated to her if she had not received the coupon in the mail by Monday, 4/13 to call us back and we would let MW know. Pt expressed understanding. Nothing further needed.

## 2019-01-01 NOTE — Telephone Encounter (Signed)
Called and spoke with pt. Pt stated she received a call from pharmacy that [facility] was not covered by insurance. Pt wanted to know if Memory Dance was equivalent to [facility] or if it was not, are there other inhalers that MW would recommend instead of Dulera since it is not covered.  Dr. Melvyn Novas, please advise for pt if Memory Dance is the equivalence to [facility] as pt stated this inhaler is covered by insurance and if it is the equivalence, is it okay to send Rx to pharmacy for pt?

## 2019-01-09 ENCOUNTER — Telehealth: Payer: Self-pay | Admitting: Internal Medicine

## 2019-01-09 NOTE — Telephone Encounter (Signed)
Called and spoke with pt who stated she never received the symbicort coupon in the mail. I stated to her we could give her another one and if she wanted to come by office to pick it up since there were problems with the mail, we could put one up front for her. Pt expressed understanding and stated she would come by office. Coupon placed in an envelope and has been placed in file cabinet up front. Nothing further needed.

## 2019-02-01 ENCOUNTER — Other Ambulatory Visit: Payer: Self-pay | Admitting: Internal Medicine

## 2019-06-13 IMAGING — MR MR 3D RECON AT [facility]
12 of 21 series · 20 of 48 positions shown · IV contrast (17 MH)
Comparison: None.

CLINICAL DATA: Pancreatic cystic lesion on outside CT. Right-sided
abdominal pain.

EXAM:
MRI ABDOMEN WITHOUT AND WITH CONTRAST (INCLUDING MRCP)
TECHNIQUE: Multiplanar multisequence MR imaging of the abdomen was performed
both before and after the administration of intravenous contrast.
Heavily T2-weighted images of the biliary and pancreatic ducts were
obtained, and three-dimensional MRCP images were rendered by post
processing.
CONTRAST:  17mL MULTIHANCE GADOBENATE DIMEGLUMINE 529 MG/ML IV SOLN

[Series 4: cor ssfse nav · coronal · 6.0mm · 0.78mm/px · 2 of 25 slices shown]
[im 1/25]
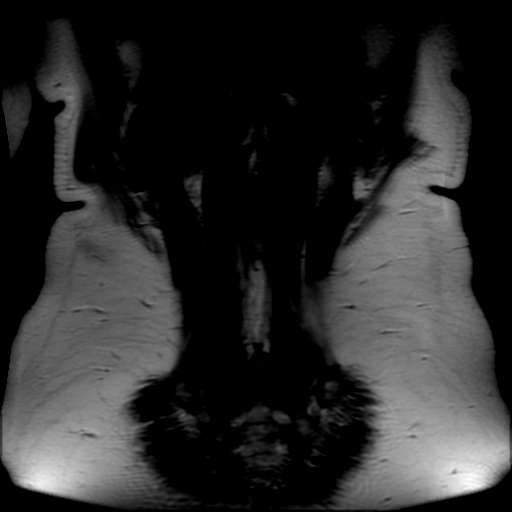
[im 25/25]
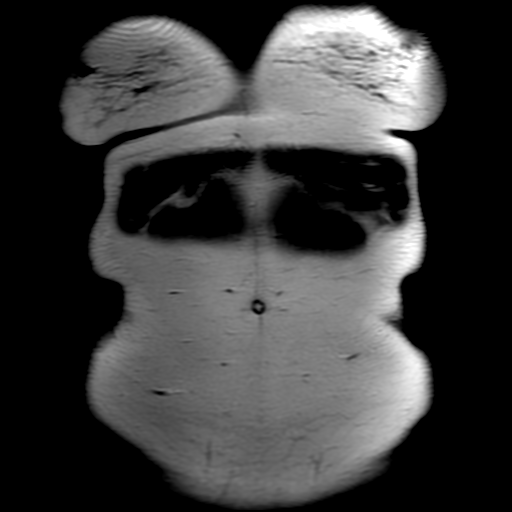

[Series 5: ax ssfse nav · axial · 6.0mm · 0.68mm/px · z∈[-64,+182]mm · 2 of 42 slices shown]
[im 1/42]
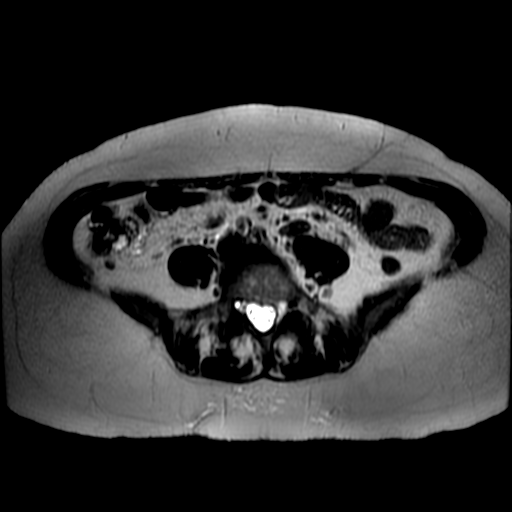
[im 42/42]
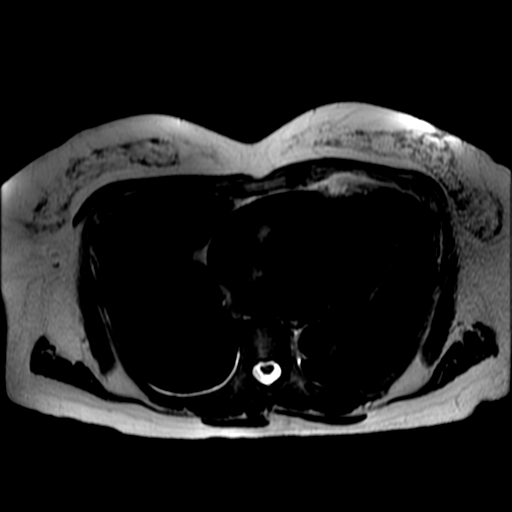

[Series 6: T2 fat-sat · axial · 6.0mm · 0.68mm/px · 1 of 42 slices shown]
[im 1/42]
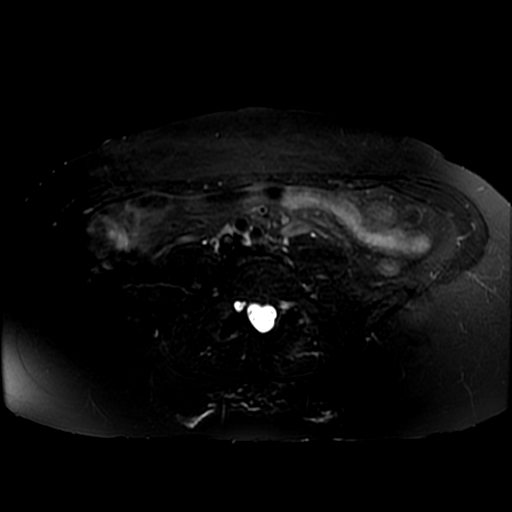

[Series 7: DWI b500 · axial · 8.0mm · 1.37mm/px · z∈[-61,+179]mm · 2 of 50 slices shown]
[im 1/50]
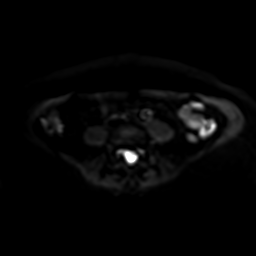
[im 50/50]
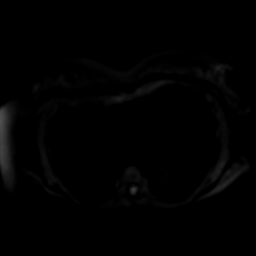

[Series 8: bSSFP · axial · 6.0mm · 0.70mm/px · 1 of 36 slices shown]
[im 1/36]
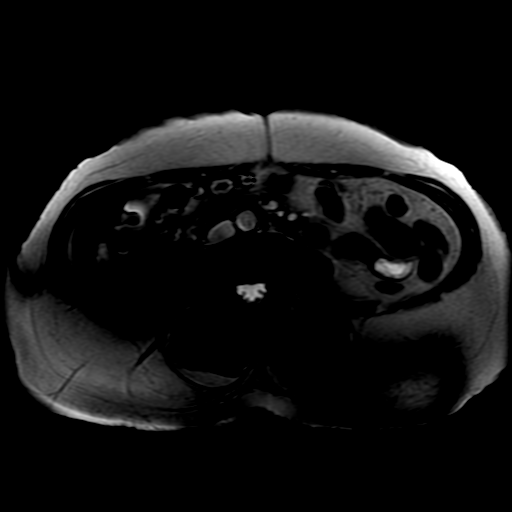

[Series 12: radial 2d thick · coronal · 40.0mm · 0.86mm/px · 1 of 6 slices shown]
[im 1/6]
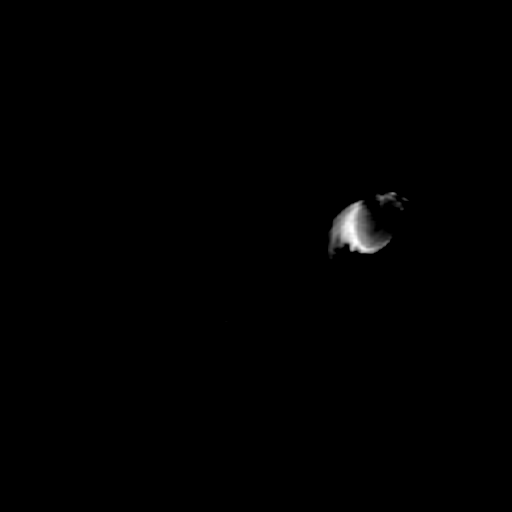

[Series 750: ADC · axial · 8.0mm · 1.37mm/px · 1 of 25 slices shown]
[im 1/25]
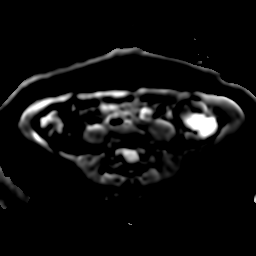

[Series 950: processed images · sagittal · 2.0mm · 0.62mm/px · 1 of 11 slices shown (1 of 2)]
[im 1/11]
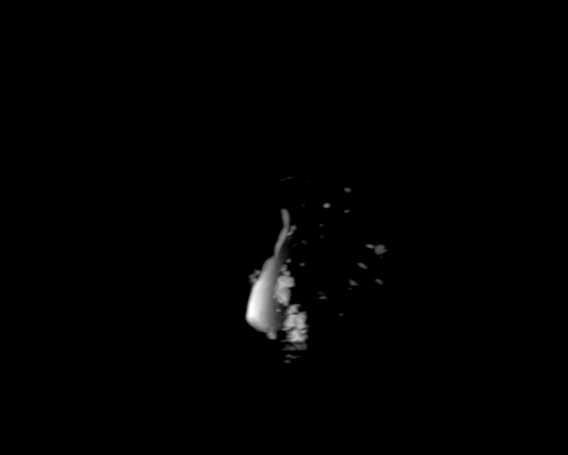

[Series 951: processed images · axial · 2.0mm · 0.62mm/px · 1 of 13 slices shown (2 of 2)]
[im 1/13]
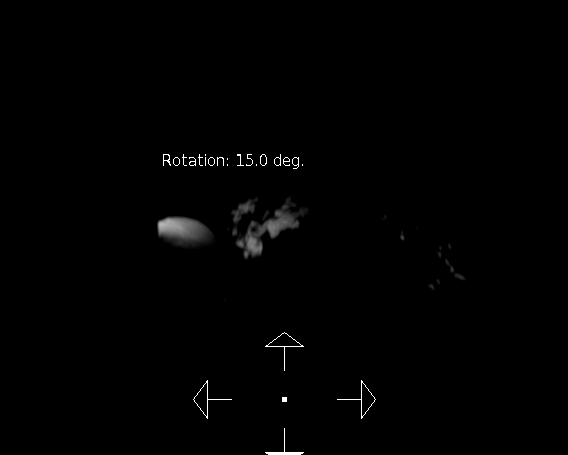

[Series 1101: T1 dynamic · axial · 5.0mm · 0.74mm/px · z∈[-68,+150]mm · 3 of 88 slices shown (1 of 2)]
[im 1/88]
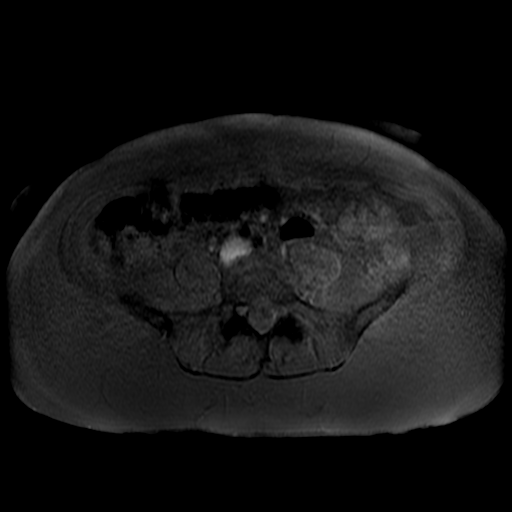
[im 44/88]
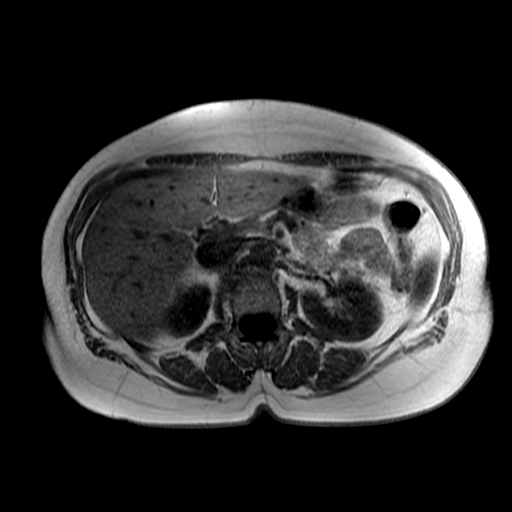
[im 88/88]
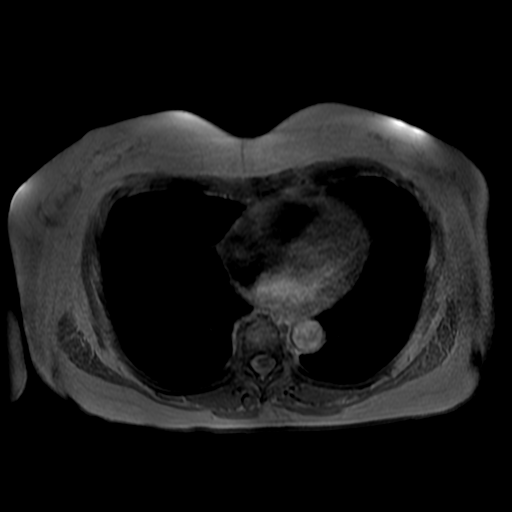

[Series 1102: T1 dynamic · axial · 5.0mm · 0.74mm/px · z∈[-68,+150]mm · 3 of 88 slices shown (2 of 2)]
[im 1/88]
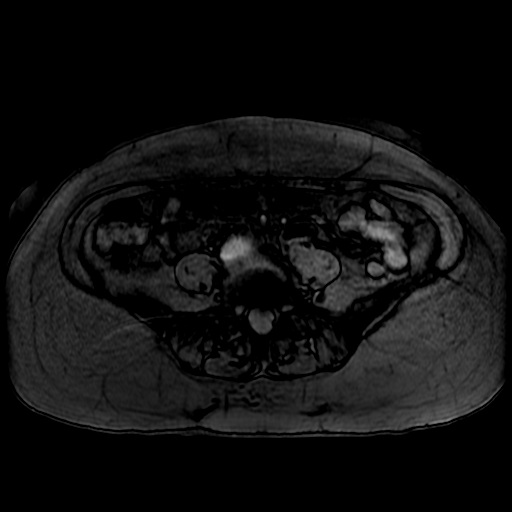
[im 44/88]
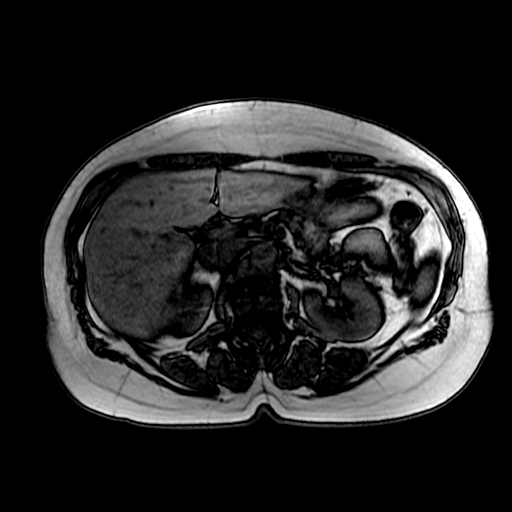
[im 88/88]
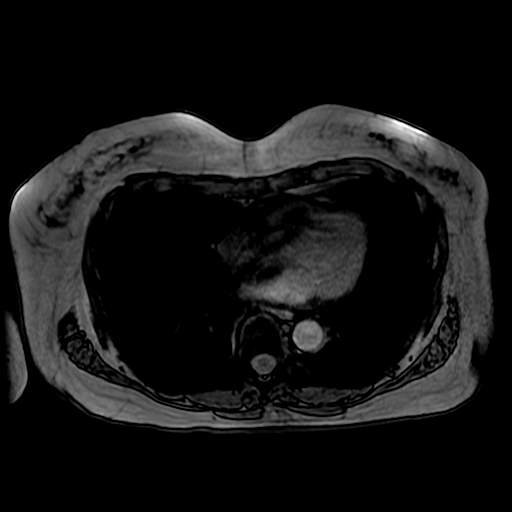

[Series 1300: T1 dynamic post-contrast · axial · non-contrast · 4.0mm · 0.86mm/px · z∈[-66,+40]mm · 2 of 108 slices shown]
[im 1/108]
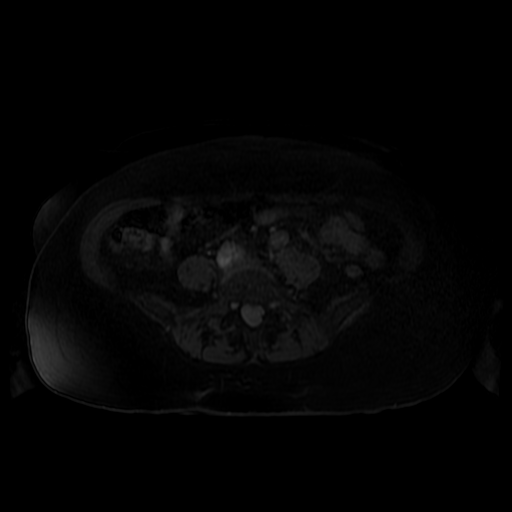
[im 54/108]
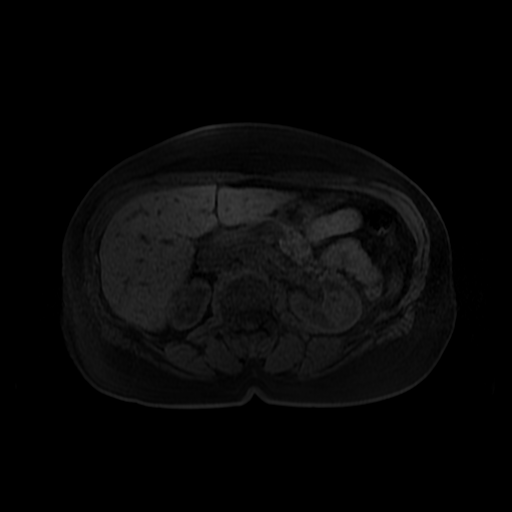

[20 of 48 positions shown; findings below may reference images not displayed]

FINDINGS: Lower chest: Heart size accentuated by a mild pectus excavatum
deformity.

Hepatobiliary: Normal liver and gallbladder. No biliary duct
dilatation. No choledocholithiasis.

Pancreas: No main pancreatic duct dilatation. There are multiple
tiny cystic foci within the pancreatic body and tail, including on
images 18 and 19/series 5. Within the uncinate process of the
pancreas, communicating with the pancreatic duct on image 58/ series
9, is a bilobed multiseptated cystic lesion versus 2 adjacent
lesions. In total, measures on the order of 3.2 x 1.4 x 1.5 cm
including on image 28/ series 5 and image 17/series 4. Demonstrates
minimal enhancement within the thin septa. No areas of solid nodular
enhancement. No acute pancreatitis.

Spleen:  Normal in size, without focal abnormality.

Adrenals/Urinary Tract: Normal adrenal glands. Normal right kidney.
Tiny lower pole left renal cyst.

Stomach/Bowel: Normal stomach and abdominal bowel loops.

Vascular/Lymphatic: Normal caliber of the aorta and branch vessels.
No retroperitoneal or retrocrural adenopathy.

Other:  No ascites.

Musculoskeletal: Convex right lumbar spine curvature.
IMPRESSION: Multi septated cystic lesion versus 2 adjacent lesions within the
uncinate process of the pancreas. Especially given smaller lesions
in the pancreas, favored to represent a pseudocyst. Alternate
explanations , including indolent cystic neoplasm, less likely. Per
consensus criteria, this warrants follow-up with pre and post
contrast abdominal MRI/ MRCP at 6 months. This recommendation
follows ACR consensus guidelines: Management of Incidental
Pancreatic Cysts: A White Paper of the ACR Incidental Findings
Committee. [HOSPITAL] [id number];[DATE].

## 2019-12-30 ENCOUNTER — Other Ambulatory Visit: Payer: Self-pay

## 2019-12-30 ENCOUNTER — Ambulatory Visit (INDEPENDENT_AMBULATORY_CARE_PROVIDER_SITE_OTHER): Payer: Commercial Managed Care - PPO | Admitting: Cardiovascular Disease

## 2019-12-30 ENCOUNTER — Encounter: Payer: Self-pay | Admitting: Cardiovascular Disease

## 2019-12-30 DIAGNOSIS — R079 Chest pain, unspecified: Secondary | ICD-10-CM | POA: Diagnosis not present

## 2019-12-30 DIAGNOSIS — E782 Mixed hyperlipidemia: Secondary | ICD-10-CM | POA: Diagnosis not present

## 2019-12-30 DIAGNOSIS — R0789 Other chest pain: Secondary | ICD-10-CM | POA: Diagnosis not present

## 2019-12-30 DIAGNOSIS — R002 Palpitations: Secondary | ICD-10-CM

## 2019-12-30 LAB — COMPREHENSIVE METABOLIC PANEL
ALT: 20 IU/L (ref 0–32)
AST: 17 IU/L (ref 0–40)
Albumin/Globulin Ratio: 1.7 (ref 1.2–2.2)
Albumin: 4.5 g/dL (ref 3.8–4.8)
Alkaline Phosphatase: 90 IU/L (ref 39–117)
BUN/Creatinine Ratio: 28 (ref 12–28)
BUN: 19 mg/dL (ref 8–27)
Bilirubin Total: 1.1 mg/dL (ref 0.0–1.2)
CO2: 24 mmol/L (ref 20–29)
Calcium: 9.6 mg/dL (ref 8.7–10.3)
Chloride: 102 mmol/L (ref 96–106)
Creatinine, Ser: 0.69 mg/dL (ref 0.57–1.00)
GFR calc Af Amer: 106 mL/min/{1.73_m2} (ref >59)
GFR calc non Af Amer: 92 mL/min/{1.73_m2} (ref >59)
Globulin, Total: 2.6 g/dL (ref 1.5–4.5)
Glucose: 92 mg/dL (ref 65–99)
Potassium: 5.2 mmol/L (ref 3.5–5.2)
Sodium: 139 mmol/L (ref 134–144)
Total Protein: 7.1 g/dL (ref 6.0–8.5)

## 2019-12-30 LAB — LIPID PANEL
Chol/HDL Ratio: 3.9 ratio (ref 0.0–4.4)
Cholesterol, Total: 233 mg/dL — ABNORMAL HIGH (ref 100–199)
HDL: 60 mg/dL (ref >39)
LDL Chol Calc (NIH): 157 mg/dL — ABNORMAL HIGH (ref 0–99)
Triglycerides: 90 mg/dL (ref 0–149)
VLDL Cholesterol Cal: 16 mg/dL (ref 5–40)

## 2019-12-30 LAB — HEPATIC FUNCTION PANEL: Bilirubin, Direct: 0.22 mg/dL (ref 0.00–0.40)

## 2019-12-30 MED ORDER — METOPROLOL TARTRATE 100 MG PO TABS: ORAL_TABLET | ORAL | 0 refills | Status: AC

## 2019-12-30 NOTE — Patient Instructions (Addendum)
Your physician recommends that you continue on your current medications as directed. Please refer to the Current Medication list given to you today.  Your physician recommends that you return for lab work in:  [place] wants you to follow-up in: [place] will receive a reminder letter in the mail two months in advance. If you don't receive a letter, please call our office to schedule the follow-up appointment. Your cardiac CT will be scheduled at one of the below locations:   [facility] [address] [place], [place] [address] [phone]   If scheduled at [facility], please arrive at the [facility] main entrance of [facility] 30 minutes prior to test start time. Proceed to the [facility] Radiology Department (first floor) to check-in and test prep.  If scheduled at [facility], please arrive 15 mins early for check-in and test prep.  Please follow these instructions carefully (unless otherwise directed):  On the Night Before the Test: . Be sure to Drink plenty of water. . Do not consume any caffeinated/decaffeinated beverages or chocolate 12 hours prior to your test. . Do not take any antihistamines 12 hours prior to your test. . If you take Metformin do not take 24 hours prior to test. On the Day of the Test: . Drink plenty of water. Do not drink any water within one hour of the test. . Do not eat any food 4 hours prior to the test. . You may take your regular medications prior to the test.  . Take metoprolol (Lopressor) two hours prior to test. . . . FEMALES- please wear underwire-free bra if available   *For Clinical Staff only. Please instruct patient the following:*        -Drink plenty of water       -Hold Furosemide/hydrochlorothiazide morning of the test       -Take metoprolol (Lopressor) 2 hours prior to test (if applicable).                  -If HR is less than  55 BPM- No Lopressor                -IF HR is greater than 55 BPM and patient is less than or equal to 65 yrs old Lopressor 100mg  x1.               Do not give Lopressor to patients with an allergy to lopressor or anyone with asthma or active COPD symptoms (currently taking steroids).       After the Test: . Drink plenty of water. . After receiving IV contrast, you may experience a mild flushed feeling. This is normal. . On occasion, you may experience a mild rash up to 24 hours after the test. This is not dangerous. If this occurs, you can take Benadryl 25 mg and increase your fluid intake. . If you experience trouble breathing, this can be serious. If it is severe call 911 IMMEDIATELY. If it is mild, please call our office. . If you take any of these medications: Glipizide/Metformin, Avandament, Glucavance, please do not take 48 hours after completing test unless otherwise instructed.   Once we have confirmed authorization from your insurance company, we will call you to set up a date and time for your test.   For non-scheduling related questions, please contact the cardiac imaging nurse navigator should you have any questions/concerns: Marchia Bond, RN Navigator  Cardiac Imaging [facility] [phone] office  For scheduling needs, including cancellations and rescheduling, please call [phone].

## 2019-12-30 NOTE — Assessment & Plan Note (Signed)
History of palpitations secondary to PVCs which have since resolved after dietary modification and limitation of caffeine.

## 2019-12-30 NOTE — Progress Notes (Signed)
12/30/2019 Emily Hays   February 26, 1956  [id number]  Primary Physician Nicholes Rough, PA-C Primary Cardiologist: Lorretta Harp MD Lupe Carney, [place]  HPI:  Emily Hays is a 64 y.o.  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 She was referred by [facility] emergency room for evaluation of PVCs.  I last saw her in the office 01/28/2018.  She relocated from [place] to [place] to be closer to her family in [place]. She works as a Oncologist at [employer]. She has no cardiac risk factors although her mother and aunts have had A. Fib. She is also heterozygous alpha-1 antitrypsin deficiency has question of reactive airways disease. She was seen in the emergency room for palpitations. She's had palpitations for 10 years and she was 63 years old during menopause. These had  waxed and waned. She did  drink 2-4 cups of caffeine a day and was  under a lot of stress at work.  An event monitor apparently showed PVCs.  She did alter her diet decrease her caffeine intake.  Her palpitations resolved.  She had been well up until 2 weeks ago when she developed atypical sharp substernal chest pain over the several days.  She saw Almyra Deforest PA-C in the office who had scheduled coronary CTA on her but her pain substernally has spontaneously resolved.  Since I saw her 2 years ago she has done well until recently when she turned 16 and is noticing some atypical chest pain with left upper extremity radiation.  Her palpitations have significantly improved and for all intents and purposes have resolved.  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  These have remained stable.    Current Meds  Medication Sig  . albuterol (PROAIR HFA) 108 (90 Base) MCG/ACT inhaler 2 puffs  every 4 hours as needed only  if your can't catch your breath  . diphenhydrAMINE (BENADRYL) 50 MG tablet Take 50 mg by mouth at bedtime as needed for allergies or sleep.  . ergocalciferol (VITAMIN D2) 1.25 MG (50000 UT) capsule Take 1 capsule twice weekly.  . fluticasone furoate-vilanterol (BREO ELLIPTA) 100-25 MCG/INH AEPB Inhale 1 puff into the lungs daily.  Marland Kitchen ibuprofen (ADVIL,MOTRIN) 200 MG tablet Take 200 mg by mouth every 6 (six) hours as needed.  . mometasone-formoterol (DULERA) 200-5 MCG/ACT AERO Take 2 puffs first thing in am and then another 2 puffs about 12 hours later.  . Pseudoeph-Doxylamine-DM-APAP (NYQUIL PO) Take 1 tablet by mouth as directed.     Allergies  Allergen Reactions  . Monosodium Glutamate Other (See Comments) and Swelling    Facial swelling, angioedema Facial swelling, angioedema   . Other     MSG causes severe headaches    Social History   Socioeconomic History  . Marital status: Married    Spouse name: Not on file  . Number of children: Not on file  . Years of education: Not on file  . Highest education level: Not on file  Occupational History  . Not on file  Tobacco Use  . Smoking status: Former Smoker    Packs/day: 0.25    Years: 8.00    Pack years: 2.00    Types: Cigarettes    Quit date: 09/24/1988    Years since quitting:  31.2  . Smokeless tobacco: Never Used  Substance and Sexual Activity  . Alcohol use: Yes    Alcohol/week: 0.0 standard drinks    Comment: occ wine  . Drug use: No  . Sexual activity: Not on file  Other Topics Concern  . Not on file  Social History Narrative  . Not on file   Social Determinants of Health   Financial Resource Strain:   . Difficulty of Paying Living Expenses:   Food Insecurity:   . Worried About Charity fundraiser in the Last Year:   . Arboriculturist in the Last Year:   Transportation Needs:   . Film/video editor (Medical):   Marland Kitchen Lack of Transportation (Non-Medical):   Physical Activity:     . Days of Exercise per Week:   . Minutes of Exercise per Session:   Stress:   . Feeling of Stress :   Social Connections:   . Frequency of Communication with Friends and Family:   . Frequency of Social Gatherings with Friends and Family:   . Attends Religious Services:   . Active Member of Clubs or Organizations:   . Attends Archivist Meetings:   Marland Kitchen Marital Status:   Intimate Partner Violence:   . Fear of Current or Ex-Partner:   . Emotionally Abused:   Marland Kitchen Physically Abused:   . Sexually Abused:      Review of Systems: General: negative for chills, fever, night sweats or weight changes.  Cardiovascular: negative for chest pain, dyspnea on exertion, edema, orthopnea, palpitations, paroxysmal nocturnal dyspnea or shortness of breath Dermatological: negative for rash Respiratory: negative for cough or wheezing Urologic: negative for hematuria Abdominal: negative for nausea, vomiting, diarrhea, bright red blood per rectum, melena, or hematemesis Neurologic: negative for visual changes, syncope, or dizziness All other systems reviewed and are otherwise negative except as noted above.    Blood pressure 114/82, pulse 76, height 5\' 4"  (1.626 m), weight 174 lb (78.9 kg), SpO2 97 %.  General appearance: alert and no distress Neck: no adenopathy, no carotid bruit, no JVD, supple, symmetrical, trachea midline and thyroid not enlarged, symmetric, no tenderness/mass/nodules Lungs: clear to auscultation bilaterally Heart: regular rate and rhythm, S1, S2 normal, no murmur, click, rub or gallop Extremities: extremities normal, atraumatic, no cyanosis or edema Pulses: 2+ and symmetric Skin: Skin color, texture, turgor normal. No rashes or lesions Neurologic: Alert and oriented X 3, normal strength and tone. Normal symmetric reflexes. 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  I personally reviewed this EKG.  ASSESSMENT AND PLAN:    Palpitations History of palpitations secondary to PVCs which have since resolved after dietary modification and limitation of caffeine.  Atypical chest pain History of atypical chest pain rating to her left arm with positive cardiac risk factors.  I will get a coronary CTA to further evaluate.  Hyperlipidemia History of hyperlipidemia not on statin therapy.  We will check a fasting lipid liver profile today.      Lorretta Harp MD FACP,FACC,FAHA, [facility] 12/30/2019 10:43 AM

## 2019-12-30 NOTE — Assessment & Plan Note (Signed)
History of atypical chest pain rating to her left arm with positive cardiac risk factors.  I will get a coronary CTA to further evaluate.

## 2019-12-30 NOTE — Assessment & Plan Note (Signed)
History of hyperlipidemia not on statin therapy.  We will check a fasting lipid liver profile today.

## 2020-02-02 ENCOUNTER — Ambulatory Visit (HOSPITAL_COMMUNITY): Payer: Commercial Managed Care - PPO

## 2020-02-05 ENCOUNTER — Telehealth (HOSPITAL_COMMUNITY): Payer: Self-pay | Admitting: *Deleted

## 2020-02-05 NOTE — Telephone Encounter (Signed)
Reaching out to patient to offer assistance regarding upcoming cardiac imaging study; pt verbalizes understanding of appt date/time, parking situation and where to check in, pre-test NPO status and medications ordered, and verified current allergies; name and call back number provided for further questions should they arise  Tomio Kirk Tai RN Navigator Cardiac Imaging [facility] [phone] office [phone] cell 

## 2020-02-08 ENCOUNTER — Other Ambulatory Visit: Payer: Self-pay

## 2020-02-08 ENCOUNTER — Ambulatory Visit (HOSPITAL_COMMUNITY)
Admission: RE | Admit: 2020-02-08 | Discharge: 2020-02-08 | Disposition: A | Discharge status: Home / Self Care | Payer: Commercial Managed Care - PPO | Source: Ambulatory Visit | Attending: Cardiovascular Disease | Admitting: Cardiovascular Disease

## 2020-02-08 DIAGNOSIS — R079 Chest pain, unspecified: Secondary | ICD-10-CM | POA: Diagnosis present

## 2020-02-08 MED ORDER — NITROGLYCERIN 0.4 MG SL SUBL
0.8000 mg | SUBLINGUAL_TABLET | Freq: Once | SUBLINGUAL | Status: AC
  Administered 2020-02-08: 0.8 mg via SUBLINGUAL

## 2020-02-08 MED ORDER — IOHEXOL 350 MG/ML SOLN
80.0000 mL | Freq: Once | INTRAVENOUS | Status: AC | PRN
  Administered 2020-02-08: 80 mL via INTRAVENOUS

## 2020-02-08 MED ORDER — NITROGLYCERIN 0.4 MG SL SUBL
SUBLINGUAL_TABLET | SUBLINGUAL | Status: AC
  Filled 2020-02-08: qty 2

## 2020-09-12 ENCOUNTER — Other Ambulatory Visit: Payer: Self-pay

## 2020-09-12 ENCOUNTER — Telehealth: Payer: Self-pay | Admitting: Cardiovascular Disease

## 2020-09-12 DIAGNOSIS — C50912 Malignant neoplasm of unspecified site of left female breast: Secondary | ICD-10-CM

## 2020-09-12 DIAGNOSIS — Z5181 Encounter for therapeutic drug level monitoring: Secondary | ICD-10-CM

## 2020-09-12 NOTE — Telephone Encounter (Signed)
Spoke to patient . She states she has been recently diagnosis with breast cancer. She would like to start Chemo on Dec 23,2022.  Per patient, her cancer doctor - Dr Verdell Carmine with [facility].  Requesting an Echo to be done prior to the start. Patient starts they will be placing a port a cath on Wed 09/14/21.   patient was seeing if  Echo can be done today or tomorrow.Patient states she thought she had an echo done in the past ,but she reviewed and she had not.   RN informed patient  will  not be able to schedule  an order  With out an order from Dr Georgiann Cocker , or Dr Gwenlyn Found or primary Nicholes Rough PA  RN informed patient to see if she can contact primary or Dr Georgiann Cocker for order.  RN will check to see if  Echo time slot is available.  patient  Verbalized understanding

## 2020-09-12 NOTE — Telephone Encounter (Signed)
Received order from Dr Sedalia Muta  Echo schedule for 09/13/20  Patient aware.

## 2020-09-12 NOTE — Telephone Encounter (Signed)
Patient called and stated that she just started chemo and the doctor said that she needs to schedule an echo immediately. Told patient that I did not have one that I seen. Please call back

## 2020-09-13 ENCOUNTER — Other Ambulatory Visit: Payer: Self-pay

## 2020-09-13 ENCOUNTER — Ambulatory Visit (INDEPENDENT_AMBULATORY_CARE_PROVIDER_SITE_OTHER): Payer: Commercial Managed Care - PPO

## 2020-09-13 DIAGNOSIS — C50912 Malignant neoplasm of unspecified site of left female breast: Secondary | ICD-10-CM

## 2020-09-13 LAB — ECHOCARDIOGRAM COMPLETE
Area-P 1/2: 3.48 cm2
S' Lateral: 2.6 cm

## 2020-09-13 NOTE — Progress Notes (Signed)
Complete echocardiogram performed.  Jimmy Thayden Lemire RDCS, RVT  

## 2020-11-22 DEATH — deceased
